# Patient Record
Sex: Male | Born: 1965
Health system: Southern US, Community
[De-identification: ages and names within clinical notes are randomized; demographics above are authoritative.]

## PROBLEM LIST (undated history)

## (undated) DIAGNOSIS — B019 Varicella without complication: Secondary | ICD-10-CM

## (undated) DIAGNOSIS — M109 Gout, unspecified: Secondary | ICD-10-CM

## (undated) DIAGNOSIS — I10 Essential (primary) hypertension: Secondary | ICD-10-CM

## (undated) DIAGNOSIS — Z8639 Personal history of other endocrine, nutritional and metabolic disease: Secondary | ICD-10-CM

## (undated) DIAGNOSIS — L989 Disorder of the skin and subcutaneous tissue, unspecified: Secondary | ICD-10-CM

## (undated) HISTORY — DX: Disorder of the skin and subcutaneous tissue, unspecified: L98.9

## (undated) HISTORY — DX: Varicella without complication: B01.9

## (undated) HISTORY — DX: Personal history of other endocrine, nutritional and metabolic disease: Z86.39

## (undated) HISTORY — DX: Gout, unspecified: M10.9

## (undated) HISTORY — DX: Essential (primary) hypertension: I10

## (undated) HISTORY — PX: ARTERY EXPLORATION: SHX5110

---
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
## 2013-08-27 ENCOUNTER — Encounter: Payer: Self-pay | Admitting: Physician Assistant

## 2013-08-27 ENCOUNTER — Ambulatory Visit (INDEPENDENT_AMBULATORY_CARE_PROVIDER_SITE_OTHER): Payer: BC Managed Care – HMO | Admitting: Physician Assistant

## 2013-08-27 VITALS — BP 136/98 | HR 79 | Temp 98.1°F | Resp 16 | Ht 73.0 in | Wt 295.2 lb

## 2013-08-27 DIAGNOSIS — I10 Essential (primary) hypertension: Secondary | ICD-10-CM | POA: Insufficient documentation

## 2013-08-27 DIAGNOSIS — Z8639 Personal history of other endocrine, nutritional and metabolic disease: Secondary | ICD-10-CM

## 2013-08-27 DIAGNOSIS — Z7189 Other specified counseling: Secondary | ICD-10-CM

## 2013-08-27 DIAGNOSIS — E1165 Type 2 diabetes mellitus with hyperglycemia: Secondary | ICD-10-CM | POA: Insufficient documentation

## 2013-08-27 DIAGNOSIS — Z862 Personal history of diseases of the blood and blood-forming organs and certain disorders involving the immune mechanism: Secondary | ICD-10-CM

## 2013-08-27 DIAGNOSIS — IMO0002 Reserved for concepts with insufficient information to code with codable children: Secondary | ICD-10-CM | POA: Insufficient documentation

## 2013-08-27 DIAGNOSIS — Z7689 Persons encountering health services in other specified circumstances: Secondary | ICD-10-CM | POA: Insufficient documentation

## 2013-08-27 DIAGNOSIS — C4492 Squamous cell carcinoma of skin, unspecified: Secondary | ICD-10-CM | POA: Insufficient documentation

## 2013-08-27 MED ORDER — LOSARTAN POTASSIUM-HCTZ 50-12.5 MG PO TABS: 1.0000 | ORAL_TABLET | Freq: Every day | ORAL | Status: DC

## 2013-08-27 NOTE — Patient Instructions (Signed)
Please retur for complete physical.  Please take blood pressure medication as directed.  We will recheck BP at next visit.  You will be contacted by Dermatology for assessment and excision of skin lesion.    You Can Quit Smoking If you are ready to quit smoking or are thinking about it, congratulations! You have chosen to help yourself be healthier and live longer! There are lots of different ways to quit smoking. Nicotine gum, nicotine patches, a nicotine inhaler, or nicotine nasal spray can help with physical craving. Hypnosis, support groups, and medicines help break the habit of smoking. TIPS TO GET OFF AND STAY OFF CIGARETTES  Learn to predict your moods. Do not let a bad situation be your excuse to have a cigarette. Some situations in your life might tempt you to have a cigarette.  Ask friends and co-workers not to smoke around you.  Make your home smoke-free.  Never have "just one" cigarette. It leads to wanting another and another. Remind yourself of your decision to quit.  On a card, make a list of your reasons for not smoking. Read it at least the same number of times a day as you have a cigarette. Tell yourself everyday, "I do not want to smoke. I choose not to smoke."  Ask someone at home or work to help you with your plan to quit smoking.  Have something planned after you eat or have a cup of coffee. Take a walk or get other exercise to perk you up. This will help to keep you from overeating.  Try a relaxation exercise to calm you down and decrease your stress. Remember, you may be tense and nervous the first two weeks after you quit. This will pass.  Find new activities to keep your hands busy. Play with a pen, coin, or rubber band. Doodle or draw things on paper.  Brush your teeth right after eating. This will help cut down the craving for the taste of tobacco after meals. You can try mouthwash too.  Try gum, breath mints, or diet candy to keep something in your mouth. IF  YOU SMOKE AND WANT TO QUIT:  Do not stock up on cigarettes. Never buy a carton. Wait until one pack is finished before you buy another.  Never carry cigarettes with you at work or at home.  Keep cigarettes as far away from you as possible. Leave them with someone else.  Never carry matches or a lighter with you.  Ask yourself, "Do I need this cigarette or is this just a reflex?"  Bet with someone that you can quit. Put cigarette money in a piggy bank every morning. If you smoke, you give up the money. If you do not smoke, by the end of the week, you keep the money.  Keep trying. It takes 21 days to change a habit!  Talk to your doctor about using medicines to help you quit. These include nicotine replacement gum, lozenges, or skin patches. Document Released: 03/04/2009 Document Revised: 07/31/2011 Document Reviewed: 03/04/2009 [facility] Patient Information 2014 [employer].

## 2013-08-27 NOTE — Assessment & Plan Note (Signed)
Lesion present for greater than 1 year.  Ulcerated lesion with raised base, seems worrisome for SCC.  Urgent referral to Dermatology placed for excisional biopsy.

## 2013-08-27 NOTE — Assessment & Plan Note (Signed)
Endorses resolved with 100+p pound weight loss.  Will return in 2 weeks for CPE with fasting labs.  Will recheck BMP and A1C at that time.

## 2013-08-27 NOTE — Progress Notes (Signed)
Patient presents to clinic today to establish care.  Acute Concerns: Needs medication refills.    Skin Lesion -- requesting referral to Dermatology.   Two lesions -- 1 on left ear. Other on mid chest. Lesions are ulcerated.  Has been present for 1+ years.  Denies change in size. Has history of multiple concerning lesions in the past that have all been benign.  No follow-up with Dermatology in 4 years.  Chronic Issues: Hypertension -- Endorses well-controlled with Hyzaar.  Needs refill of medication.  Denies chest pain, palpitations, lightheadedness, dizziness, headache or vision changes.  Hx of Diabetes 2/2 Morbid Obesity -- Endorses resolution of elevated glucose with significant weight loss.  Health Maintenance: Dental -- Overdue Vision -- UTD Immunizations -- Declines flu shot.  Due for Tetanus.  Declines at today's visit.  Past Medical History  Diagnosis Date  . HTN (hypertension)   . Gout   . Hx of diabetes mellitus     Resolved  . Chicken pox   . Skin lesion     Benign    Past Surgical History  Procedure Laterality Date  . Tonsillectomy and adenoidectomy  1973  . Artery exploration      Right Forearm    No current outpatient prescriptions on file prior to visit.   No current facility-administered medications on file prior to visit.    Allergies  Allergen Reactions  . Penicillins Anaphylaxis    Family History  Problem Relation Age of Onset  . Heart disease Father 25    Deceased  . Heart attack Father   . Hypertension Father   . Diabetes Father   . Arthritis-Osteo Mother     Living  . Colon cancer Mother   . Stroke Mother   . Hypertension Mother   . Breast cancer Mother   . Asthma Mother   . Heart disease Paternal Grandfather   . Heart attack Paternal Grandfather   . Heart attack Maternal Grandmother   . Healthy Sister   . Healthy Brother   . Asthma Son     x2  . Asthma Maternal Grandfather     History   Social History  . Marital Status:  Divorced    Spouse Name: N/A    Number of Children: N/A  . Years of Education: N/A   Occupational History  . Not on file.   Social History Main Topics  . Smoking status: Current Every Day Smoker  . Smokeless tobacco: Never Used  . Alcohol Use: 3.5 oz/week    7 drink(s) per week  . Drug Use: No  . Sexual Activity: Yes   Other Topics Concern  . Not on file   Social History Narrative  . No narrative on file   Review of Systems  Constitutional: Negative for fever, weight loss and malaise/fatigue.  Eyes: Negative for blurred vision, double vision, photophobia and pain.  Respiratory: Negative for shortness of breath.   Cardiovascular: Negative for chest pain and palpitations.  Neurological: Negative for headaches.   BP 136/98  Pulse 79  Temp(Src) 98.1 F (36.7 C) (Oral)  Resp 16  Ht 6\' 1"  (1.854 m)  Wt 295 lb 4 oz (133.925 kg)  BMI 38.96 kg/m2  SpO2 98%  Physical Exam  Vitals reviewed. Constitutional: He is oriented to person, place, and time and well-developed, well-nourished, and in no distress.  HENT:  Head: Normocephalic and atraumatic.  Right Ear: External ear normal.  Left Ear: External ear normal.  Nose: Nose normal.  Mouth/Throat: Oropharynx is  clear and moist. No oropharyngeal exudate.  Eyes: Conjunctivae are normal. Pupils are equal, round, and reactive to light.  Neck: Neck supple.  Cardiovascular: Normal rate, regular rhythm, normal heart sounds and intact distal pulses.   Pulmonary/Chest: Effort normal and breath sounds normal. No respiratory distress. He has no wheezes.  Lymphadenopathy:    He has no cervical adenopathy.  Neurological: He is alert and oriented to person, place, and time.  Skin: Skin is warm and dry.  Presence of an erythematous, ulcerated lesion just anterior to left auricle ~ 2 cm in diameter.  Presence of a 2 cm raised, ulcerated lesion of mid chest also noted.  Lesions concerning for SCC.  Psychiatric: Affect normal.     Assessment/Plan: Essential hypertension, benign Asymptomatic.  Medications refilled.  Continue current regimen.  SCC (squamous cell carcinoma) Lesion present for greater than 1 year.  Ulcerated lesion with raised base, seems worrisome for SCC.  Urgent referral to Dermatology placed for excisional biopsy.  Hx of type 2 diabetes mellitus Endorses resolved with 100+p pound weight loss.  Will return in 2 weeks for CPE with fasting labs.  Will recheck BMP and A1C at that time.  Encounter to establish care Medical history reviewed.  Patient due for tetanus.  Declines at today's visit.  Is returning for CPE with fasting labs.

## 2013-08-27 NOTE — Assessment & Plan Note (Signed)
Medical history reviewed.  Patient due for tetanus.  Declines at today's visit.  Is returning for CPE with fasting labs.

## 2013-08-27 NOTE — Progress Notes (Signed)
Pre visit review using our clinic review tool, if applicable. No additional management support is needed unless otherwise documented below in the visit note/SLS  

## 2013-08-27 NOTE — Assessment & Plan Note (Signed)
Asymptomatic.  Medications refilled.  Continue current regimen.

## 2013-09-12 ENCOUNTER — Telehealth: Payer: Self-pay | Admitting: Physician Assistant

## 2013-09-12 ENCOUNTER — Encounter: Payer: Self-pay | Admitting: Physician Assistant

## 2013-09-12 ENCOUNTER — Ambulatory Visit (INDEPENDENT_AMBULATORY_CARE_PROVIDER_SITE_OTHER): Payer: BC Managed Care – HMO | Admitting: Physician Assistant

## 2013-09-12 VITALS — BP 140/92 | HR 68 | Temp 98.7°F | Resp 16 | Ht 73.0 in | Wt 292.0 lb

## 2013-09-12 DIAGNOSIS — Z136 Encounter for screening for cardiovascular disorders: Secondary | ICD-10-CM

## 2013-09-12 DIAGNOSIS — Z8639 Personal history of other endocrine, nutritional and metabolic disease: Secondary | ICD-10-CM

## 2013-09-12 DIAGNOSIS — Z23 Encounter for immunization: Secondary | ICD-10-CM

## 2013-09-12 DIAGNOSIS — Z862 Personal history of diseases of the blood and blood-forming organs and certain disorders involving the immune mechanism: Secondary | ICD-10-CM

## 2013-09-12 DIAGNOSIS — I10 Essential (primary) hypertension: Secondary | ICD-10-CM

## 2013-09-12 DIAGNOSIS — Z0000 Encounter for general adult medical examination without abnormal findings: Secondary | ICD-10-CM

## 2013-09-12 LAB — BASIC METABOLIC PANEL
BUN: 17 mg/dL (ref 6–23)
CHLORIDE: 108 meq/L (ref 96–112)
CO2: 25 mEq/L (ref 19–32)
CREATININE: 1.02 mg/dL (ref 0.50–1.35)
Calcium: 9.2 mg/dL (ref 8.4–10.5)
Glucose, Bld: 101 mg/dL — ABNORMAL HIGH (ref 70–99)
POTASSIUM: 4.3 meq/L (ref 3.5–5.3)
Sodium: 141 mEq/L (ref 135–145)

## 2013-09-12 LAB — CBC WITH DIFFERENTIAL/PLATELET
Basophils Absolute: 0.1 10*3/uL (ref 0.0–0.1)
Basophils Relative: 1 % (ref 0–1)
EOS PCT: 5 % (ref 0–5)
Eosinophils Absolute: 0.6 10*3/uL (ref 0.0–0.7)
HEMATOCRIT: 46.5 % (ref 39.0–52.0)
Hemoglobin: 16.4 g/dL (ref 13.0–17.0)
LYMPHS ABS: 2.5 10*3/uL (ref 0.7–4.0)
LYMPHS PCT: 22 % (ref 12–46)
MCH: 32 pg (ref 26.0–34.0)
MCHC: 35.3 g/dL (ref 30.0–36.0)
MCV: 90.6 fL (ref 78.0–100.0)
MONO ABS: 0.9 10*3/uL (ref 0.1–1.0)
Monocytes Relative: 8 % (ref 3–12)
Neutro Abs: 7.2 10*3/uL (ref 1.7–7.7)
Neutrophils Relative %: 64 % (ref 43–77)
Platelets: 215 10*3/uL (ref 150–400)
RBC: 5.13 MIL/uL (ref 4.22–5.81)
RDW: 14.1 % (ref 11.5–15.5)
WBC: 11.3 10*3/uL — AB (ref 4.0–10.5)

## 2013-09-12 LAB — TSH: TSH: 1.134 u[IU]/mL (ref 0.350–4.500)

## 2013-09-12 LAB — LIPID PANEL
CHOL/HDL RATIO: 5.4 ratio
CHOLESTEROL: 172 mg/dL (ref 0–200)
HDL: 32 mg/dL — AB (ref >39)
LDL Cholesterol: 93 mg/dL (ref 0–99)
Triglycerides: 233 mg/dL — ABNORMAL HIGH (ref <150)
VLDL: 47 mg/dL — ABNORMAL HIGH (ref 0–40)

## 2013-09-12 LAB — HEPATIC FUNCTION PANEL
ALT: 31 U/L (ref 0–53)
AST: 23 U/L (ref 0–37)
Albumin: 4.1 g/dL (ref 3.5–5.2)
Alkaline Phosphatase: 57 U/L (ref 39–117)
Bilirubin, Direct: 0.1 mg/dL (ref 0.0–0.3)
Indirect Bilirubin: 0.5 mg/dL (ref 0.2–1.2)
TOTAL PROTEIN: 6.9 g/dL (ref 6.0–8.3)
Total Bilirubin: 0.6 mg/dL (ref 0.2–1.2)

## 2013-09-12 LAB — HEMOGLOBIN A1C
Hgb A1c MFr Bld: 5.9 % — ABNORMAL HIGH (ref <5.7)
Mean Plasma Glucose: 123 mg/dL — ABNORMAL HIGH (ref <117)

## 2013-09-12 NOTE — Progress Notes (Signed)
Pre visit review using our clinic review tool, if applicable. No additional management support is needed unless otherwise documented below in the visit note/SLS  

## 2013-09-12 NOTE — Assessment & Plan Note (Signed)
DASH diet handout given.  BP improved on recheck.  EKG obtained due to early beat auscultated on exam.  EKG reveals sinus bradycardia but not arrhythmia.  Continue current regimen.  Will obtain BMP.

## 2013-09-12 NOTE — Progress Notes (Signed)
Patient presents to clinic today for annual exam.  Patient is fasting for labs.   No acute concerns at today's visit.  Patient with hypertension, previously controlled on Hyzaar.  Denies chest pain, palpitations, lightheadedness, vision changes.  Endorses some decreased sleep at night 2/2 stress from new job.  Does not think that he needs medication for this.   Patient has seen dermatologist for skin lesions concerning for carcinoma.  Patient is awaiting biopsy results.  Past Medical History  Diagnosis Date  . HTN (hypertension)   . Gout   . Hx of diabetes mellitus     Resolved  . Chicken pox   . Skin lesion     Benign    Current Outpatient Prescriptions on File Prior to Visit  Medication Sig Dispense Refill  . allopurinol (ZYLOPRIM) 300 MG tablet Take 300 mg by mouth daily as needed.      Marland Kitchen aspirin 81 MG tablet Take 81 mg by mouth daily.      . Chromium Picolinate 200 MCG CAPS Take 1 capsule by mouth daily.      Marland Kitchen losartan-hydrochlorothiazide (HYZAAR) 50-12.5 MG per tablet Take 1 tablet by mouth daily.  30 tablet  1   No current facility-administered medications on file prior to visit.    Allergies  Allergen Reactions  . Penicillins Anaphylaxis    Family History  Problem Relation Age of Onset  . Heart disease Father 60    Deceased  . Heart attack Father   . Hypertension Father   . Diabetes Father   . Arthritis-Osteo Mother     Living  . Colon cancer Mother   . Stroke Mother   . Hypertension Mother   . Breast cancer Mother   . Asthma Mother   . Heart disease Paternal Grandfather   . Heart attack Paternal Grandfather   . Heart attack Maternal Grandmother   . Healthy Sister   . Healthy Brother   . Asthma Son     x2  . Asthma Maternal Grandfather     History   Social History  . Marital Status: Divorced    Spouse Name: N/A    Number of Children: N/A  . Years of Education: N/A   Social History Main Topics  . Smoking status: Current Every Day Smoker  .  Smokeless tobacco: Never Used  . Alcohol Use: 3.5 oz/week    7 drink(s) per week  . Drug Use: No  . Sexual Activity: Yes   Other Topics Concern  . None   Social History Narrative  . None   Review of Systems - See HPI.  All other ROS are negative.  BP 140/92  Pulse 68  Temp(Src) 98.7 F (37.1 C) (Oral)  Resp 16  Ht 6\' 1"  (1.854 m)  Wt 292 lb (132.45 kg)  BMI 38.53 kg/m2  SpO2 97%  Physical Exam  Vitals reviewed. Constitutional: He is oriented to person, place, and time and well-developed, well-nourished, and in no distress.  HENT:  Head: Normocephalic and atraumatic.  Right Ear: External ear normal.  Left Ear: External ear normal.  Nose: Nose normal.  Mouth/Throat: Oropharynx is clear and moist. No oropharyngeal exudate.  TM within normal limits bilaterally.  Eyes: Conjunctivae are normal. Pupils are equal, round, and reactive to light.  Neck: Neck supple.  Cardiovascular: Normal rate, normal heart sounds and intact distal pulses.   No murmur heard. Occasional early beat auscultated.  Pulmonary/Chest: Effort normal and breath sounds normal. No respiratory distress. He has no  wheezes. He has no rales. He exhibits no tenderness.  Abdominal: Soft. Bowel sounds are normal. He exhibits no distension and no mass. There is no tenderness. There is no rebound and no guarding.  Musculoskeletal: Normal range of motion. He exhibits no tenderness.  Lymphadenopathy:    He has no cervical adenopathy.  Neurological: He is alert and oriented to person, place, and time. He has normal reflexes. No cranial nerve deficit.  Psychiatric: Affect normal.   Assessment/Plan: Essential hypertension, benign DASH diet handout given.  BP improved on recheck.  EKG obtained due to early beat auscultated on exam.  EKG reveals sinus bradycardia but not arrhythmia.  Continue current regimen.  Will obtain BMP.  Visit for preventive health examination Will obtain fasting labs.  Tetanus given.  Other  health maintenance UTD.  Hx of type 2 diabetes mellitus Will check BMP and A1C

## 2013-09-12 NOTE — Telephone Encounter (Signed)
Dr. Freddi Starr -- [facility]. [address], [place], [place] [address] [phone]

## 2013-09-12 NOTE — Patient Instructions (Signed)
Please obtain labs.  I will call you with your results.  Continue medications as directed.  Follow-up with Dermatologist.  Read information below on DASH diet.  Try to increase exercise as this is a good stress reliever and will help you rest at night.  We will determine follow-up based on lab results.  DASH Diet The DASH diet stands for "Dietary Approaches to Stop Hypertension." It is a healthy eating plan that has been shown to reduce high blood pressure (hypertension) in as little as 14 days, while also possibly providing other significant health benefits. These other health benefits include reducing the risk of breast cancer after menopause and reducing the risk of type 2 diabetes, heart disease, colon cancer, and stroke. Health benefits also include weight loss and slowing kidney failure in patients with chronic kidney disease.  DIET GUIDELINES  Limit salt (sodium). Your diet should contain less than 1500 mg of sodium daily.  Limit refined or processed carbohydrates. Your diet should include mostly whole grains. Desserts and added sugars should be used sparingly.  Include small amounts of heart-healthy fats. These types of fats include nuts, oils, and tub margarine. Limit saturated and trans fats. These fats have been shown to be harmful in the body. CHOOSING FOODS  The following food groups are based on a 2000 calorie diet. See your Registered Dietitian for individual calorie needs. Grains and Grain Products (6 to 8 servings daily)  Eat More Often: Whole-wheat bread, brown rice, whole-grain or wheat pasta, quinoa, popcorn without added fat or salt (air popped).  Eat Less Often: White bread, white pasta, white rice, cornbread. Vegetables (4 to 5 servings daily)  Eat More Often: Fresh, frozen, and canned vegetables. Vegetables may be raw, steamed, roasted, or grilled with a minimal amount of fat.  Eat Less Often/Avoid: Creamed or fried vegetables. Vegetables in a cheese sauce. Fruit (4 to  5 servings daily)  Eat More Often: All fresh, canned (in natural juice), or frozen fruits. Dried fruits without added sugar. One hundred percent fruit juice ( cup [237 mL] daily).  Eat Less Often: Dried fruits with added sugar. Canned fruit in light or heavy syrup. [employer], Fish, and Poultry (2 servings or less daily. One serving is 3 to 4 oz [85-114 g]).  Eat More Often: Ninety percent or leaner ground beef, tenderloin, sirloin. Round cuts of beef, chicken breast, [place] breast. All fish. Grill, bake, or broil your meat. Nothing should be fried.  Eat Less Often/Avoid: Fatty cuts of meat, [place], or chicken leg, thigh, or wing. Fried cuts of meat or fish. Dairy (2 to 3 servings)  Eat More Often: Low-fat or fat-free milk, low-fat plain or light yogurt, reduced-fat or part-skim cheese.  Eat Less Often/Avoid: Milk (whole, 2%).Whole milk yogurt. Full-fat cheeses. Nuts, Seeds, and Legumes (4 to 5 servings per week)  Eat More Often: All without added salt.  Eat Less Often/Avoid: Salted nuts and seeds, canned beans with added salt. Fats and Sweets (limited)  Eat More Often: Vegetable oils, tub margarines without trans fats, sugar-free gelatin. Mayonnaise and salad dressings.  Eat Less Often/Avoid: Coconut oils, palm oils, butter, stick margarine, cream, half and half, cookies, candy, pie. FOR MORE INFORMATION The Dash Diet Eating Plan: www.dashdiet.org Document Released: 04/27/2011 Document Revised: 07/31/2011 Document Reviewed: 04/27/2011 [facility] Patient Information 2014 [place], [place].

## 2013-09-12 NOTE — Assessment & Plan Note (Signed)
Will obtain fasting labs.  Tetanus given.  Other health maintenance UTD.

## 2013-09-12 NOTE — Telephone Encounter (Signed)
Relevant patient education assigned to patient using Emmi. ° °

## 2013-09-12 NOTE — Assessment & Plan Note (Signed)
Will check BMP and A1C

## 2013-09-12 NOTE — Telephone Encounter (Signed)
Patient says that he didn't get the name of a chiropractor that ya'll were discussing about him going to?

## 2013-09-12 NOTE — Addendum Note (Signed)
Addended by: Rockwell Germany on: 09/12/2013 10:32 AM   Modules accepted: Orders

## 2013-09-13 LAB — URINALYSIS, ROUTINE W REFLEX MICROSCOPIC
BILIRUBIN URINE: NEGATIVE
GLUCOSE, UA: NEGATIVE mg/dL
Hgb urine dipstick: NEGATIVE
Ketones, ur: NEGATIVE mg/dL
Leukocytes, UA: NEGATIVE
Nitrite: NEGATIVE
PROTEIN: NEGATIVE mg/dL
Specific Gravity, Urine: 1.021 (ref 1.005–1.030)
Urobilinogen, UA: 0.2 mg/dL (ref 0.0–1.0)
pH: 5.5 (ref 5.0–8.0)

## 2013-09-13 LAB — URINALYSIS, MICROSCOPIC ONLY
Bacteria, UA: NONE SEEN
Casts: NONE SEEN
Crystals: NONE SEEN
Squamous Epithelial / LPF: NONE SEEN

## 2013-09-13 LAB — PSA: PSA: 0.22 ng/mL (ref <=4.00)

## 2013-09-15 ENCOUNTER — Telehealth: Payer: Self-pay | Admitting: Physician Assistant

## 2013-09-15 NOTE — Telephone Encounter (Signed)
Left detailed message informing patient of this. °

## 2013-09-15 NOTE — Telephone Encounter (Signed)
Relevant patient education assigned to patient using Emmi. ° °

## 2013-10-08 ENCOUNTER — Telehealth: Payer: Self-pay | Admitting: Physician Assistant

## 2013-10-08 NOTE — Telephone Encounter (Signed)
Please make patient aware that I have received the Wellness form from his employer.  I have completed form and have placed it to be faxed back to them.  He should call them in a couple of days to verify they have received the form.

## 2013-10-10 NOTE — Telephone Encounter (Signed)
LMOM with contact name and number with provider instructions/SLS

## 2013-10-23 ENCOUNTER — Other Ambulatory Visit: Payer: Self-pay | Admitting: Physician Assistant

## 2013-10-23 NOTE — Telephone Encounter (Signed)
Rx request to pharmacy/SLS  

## 2013-11-17 ENCOUNTER — Telehealth: Payer: Self-pay | Admitting: *Deleted

## 2013-11-17 MED ORDER — LOSARTAN POTASSIUM-HCTZ 50-12.5 MG PO TABS: ORAL_TABLET | ORAL | Status: DC

## 2013-11-17 NOTE — Telephone Encounter (Signed)
Received fax from [facility] requesting to dispense 90 day supply of losartan-hctz. Refill sent, #90 x 1 refill.

## 2013-12-09 ENCOUNTER — Telehealth: Payer: Self-pay

## 2013-12-09 NOTE — Telephone Encounter (Signed)
Pt contacted office about a Wellness form to his [employer] that was to be submitted.Pt states company has not received it yet.Is there a way to Re-submit this form? Plz Advise

## 2013-12-10 NOTE — Telephone Encounter (Signed)
LMOM with contact name and number for return call RE: original paperwork sent to Employer in May '15 and per provider instructions, pt will need to find out if he can obtain a new form for re-completion/SLS

## 2013-12-11 NOTE — Telephone Encounter (Signed)
Caller states that they can get a new Wellness Form and will have it sent to office to be completed & faxed to Insurance holder again/SLS

## 2014-01-07 ENCOUNTER — Telehealth: Payer: Self-pay | Admitting: Physician Assistant

## 2014-01-07 NOTE — Telephone Encounter (Signed)
Finally received Wellness forms again from company.  Have completed forms.  Have faxed them over and received confirmation.  I have kept a copy for our records and have a copy here for him if he would like it.

## 2014-01-12 NOTE — Telephone Encounter (Signed)
Patient informed, understood & request copy to be mailed; letter out 08.24.15/SLS

## 2014-05-24 ENCOUNTER — Other Ambulatory Visit: Payer: Self-pay | Admitting: Family

## 2014-05-25 ENCOUNTER — Telehealth: Payer: Self-pay | Admitting: Physician Assistant

## 2014-05-25 NOTE — Telephone Encounter (Signed)
Requested drug refills are authorized for 30-day only per provider, the patient needs further evaluation and/or laboratory testing before further refills are given. Ask him to make an appointment for this; pharmacy made aware via telephone/SLS

## 2014-05-25 NOTE — Telephone Encounter (Signed)
Caller name: mimi from [facility]  Relation to pt: Call back number: [phone] Pharmacy:  Reason for call:   Per patient insurance only covers for a 90 day supply of losartan. Please refax.

## 2014-07-04 ENCOUNTER — Other Ambulatory Visit: Payer: Self-pay | Admitting: Family

## 2014-10-02 ENCOUNTER — Other Ambulatory Visit: Payer: Self-pay | Admitting: Physician Assistant

## 2014-10-09 ENCOUNTER — Other Ambulatory Visit: Payer: Self-pay | Admitting: Physician Assistant

## 2014-12-11 ENCOUNTER — Ambulatory Visit (INDEPENDENT_AMBULATORY_CARE_PROVIDER_SITE_OTHER): Payer: Self-pay | Admitting: Physician Assistant

## 2014-12-11 ENCOUNTER — Other Ambulatory Visit (HOSPITAL_COMMUNITY)
Admission: RE | Admit: 2014-12-11 | Discharge: 2014-12-11 | Disposition: A | Discharge status: Home / Self Care | Payer: Self-pay | Source: Ambulatory Visit | Attending: Physician Assistant | Admitting: Physician Assistant

## 2014-12-11 ENCOUNTER — Encounter: Payer: Self-pay | Admitting: Physician Assistant

## 2014-12-11 ENCOUNTER — Telehealth: Payer: Self-pay | Admitting: Physician Assistant

## 2014-12-11 VITALS — BP 142/90 | HR 89 | Temp 98.0°F | Ht 73.0 in | Wt 297.0 lb

## 2014-12-11 DIAGNOSIS — B3742 Candidal balanitis: Secondary | ICD-10-CM

## 2014-12-11 DIAGNOSIS — R5382 Chronic fatigue, unspecified: Secondary | ICD-10-CM

## 2014-12-11 DIAGNOSIS — R369 Urethral discharge, unspecified: Secondary | ICD-10-CM

## 2014-12-11 DIAGNOSIS — Z113 Encounter for screening for infections with a predominantly sexual mode of transmission: Secondary | ICD-10-CM | POA: Insufficient documentation

## 2014-12-11 DIAGNOSIS — R631 Polydipsia: Secondary | ICD-10-CM

## 2014-12-11 LAB — CBC WITH DIFFERENTIAL/PLATELET
Basophils Absolute: 0.1 10*3/uL (ref 0.0–0.1)
Basophils Relative: 1 % (ref 0–1)
EOS ABS: 0.5 10*3/uL (ref 0.0–0.7)
Eosinophils Relative: 5 % (ref 0–5)
HCT: 48.3 % (ref 39.0–52.0)
Hemoglobin: 16.4 g/dL (ref 13.0–17.0)
Lymphocytes Relative: 29 % (ref 12–46)
Lymphs Abs: 2.6 10*3/uL (ref 0.7–4.0)
MCH: 31.9 pg (ref 26.0–34.0)
MCHC: 34 g/dL (ref 30.0–36.0)
MCV: 94 fL (ref 78.0–100.0)
MPV: 11.4 fL (ref 8.6–12.4)
Monocytes Absolute: 0.5 10*3/uL (ref 0.1–1.0)
Monocytes Relative: 6 % (ref 3–12)
Neutro Abs: 5.3 10*3/uL (ref 1.7–7.7)
Neutrophils Relative %: 59 % (ref 43–77)
Platelets: 160 10*3/uL (ref 150–400)
RBC: 5.14 MIL/uL (ref 4.22–5.81)
RDW: 13.2 % (ref 11.5–15.5)
WBC: 9 10*3/uL (ref 4.0–10.5)

## 2014-12-11 LAB — HEMOGLOBIN A1C
HEMOGLOBIN A1C: 12.5 % — AB (ref <5.7)
Mean Plasma Glucose: 312 mg/dL — ABNORMAL HIGH (ref <117)

## 2014-12-11 LAB — COMPREHENSIVE METABOLIC PANEL
ALBUMIN: 4.3 g/dL (ref 3.5–5.2)
ALT: 60 U/L — AB (ref 0–53)
AST: 58 U/L — AB (ref 0–37)
Alkaline Phosphatase: 131 U/L — ABNORMAL HIGH (ref 39–117)
BUN: 14 mg/dL (ref 6–23)
CHLORIDE: 97 meq/L (ref 96–112)
CO2: 21 meq/L (ref 19–32)
Calcium: 9.2 mg/dL (ref 8.4–10.5)
Creat: 1.04 mg/dL (ref 0.50–1.35)
GLUCOSE: 481 mg/dL — AB (ref 70–99)
Potassium: 4.1 mEq/L (ref 3.5–5.3)
Sodium: 135 mEq/L (ref 135–145)
TOTAL PROTEIN: 7.5 g/dL (ref 6.0–8.3)
Total Bilirubin: 1.1 mg/dL (ref 0.2–1.2)

## 2014-12-11 LAB — POCT URINALYSIS DIPSTICK
Bilirubin, UA: NEGATIVE
Ketones, UA: NEGATIVE
Leukocytes, UA: NEGATIVE
Nitrite, UA: NEGATIVE
PH UA: 6
Protein, UA: NEGATIVE
RBC UA: NEGATIVE
Spec Grav, UA: 1.015
UROBILINOGEN UA: 0.2

## 2014-12-11 MED ORDER — CLOTRIMAZOLE-BETAMETHASONE 1-0.05 % EX CREA: 1.0000 "application " | TOPICAL_CREAM | Freq: Two times a day (BID) | CUTANEOUS | Status: DC

## 2014-12-11 MED ORDER — FLUCONAZOLE 50 MG PO TABS: 50.0000 mg | ORAL_TABLET | Freq: Every day | ORAL | Status: DC

## 2014-12-11 NOTE — Telephone Encounter (Signed)
Noted. Routed to C. Hassell Done, PA-C.

## 2014-12-11 NOTE — Progress Notes (Signed)
Patient presents to clinic today c/o worsening fatigue over the past 3-4 months. Patient has not been seen in over a year. Patient denies depressed mood or change to stress levels that he can attribute fatigue to. Has been doing multiple colon cleanses and OTC weight loss supplements. Endorses stopping BP medication around 3 months ago as he did not "feel any different" while taking medications. Patient notes increased thirst and urinary frequency but states he is drinking large amounts of water. Denies dysuria, hematuria or flank pain. Endorses red itching rash of penile head and shaft beginning 3 weeks ago. States he has not been sexually active in the past year. Denies discharge from penis. Has started some OTC Monistat that seems to be helping symptoms. Patient is a circumcised male.  Past Medical History  Diagnosis Date  . HTN (hypertension)   . Gout   . Hx of diabetes mellitus     Resolved  . Chicken pox   . Skin lesion     Benign    Current Outpatient Prescriptions on File Prior to Visit  Medication Sig Dispense Refill  . allopurinol (ZYLOPRIM) 300 MG tablet Take 300 mg by mouth daily as needed.    Marland Kitchen aspirin 81 MG tablet Take 81 mg by mouth daily.    . Chromium Picolinate 200 MCG CAPS Take 1 capsule by mouth daily.    Marland Kitchen losartan-hydrochlorothiazide (HYZAAR) 50-12.5 MG per tablet TAKE 1 TABLET BY MOUTH EVERY DAY (Patient not taking: Reported on 12/11/2014) 30 tablet 0   No current facility-administered medications on file prior to visit.    Allergies  Allergen Reactions  . Penicillins Anaphylaxis    Family History  Problem Relation Age of Onset  . Heart disease Father 48    Deceased  . Heart attack Father   . Hypertension Father   . Diabetes Father   . Arthritis-Osteo Mother     Living  . Colon cancer Mother   . Stroke Mother   . Hypertension Mother   . Breast cancer Mother   . Asthma Mother   . Heart disease Paternal Grandfather   . Heart attack Paternal  Grandfather   . Heart attack Maternal Grandmother   . Healthy Sister   . Healthy Brother   . Asthma Son     x2  . Asthma Maternal Grandfather     History   Social History  . Marital Status: Divorced    Spouse Name: N/A  . Number of Children: N/A  . Years of Education: N/A   Social History Main Topics  . Smoking status: Former Smoker    Quit date: 05/14/2014  . Smokeless tobacco: Never Used  . Alcohol Use: 4.2 oz/week    7 Standard drinks or equivalent per week  . Drug Use: No  . Sexual Activity: Yes   Other Topics Concern  . None   Social History Narrative   Review of Systems - See HPI.  All other ROS are negative.  BP 142/90 mmHg  Pulse 89  Temp(Src) 98 F (36.7 C) (Oral)  Ht _0  (1.854 m)  Wt 297 lb (134.718 kg)  BMI 39.19 kg/m2  SpO2 97%  Physical Exam  Constitutional: He is oriented to person, place, and time and well-developed, well-nourished, and in no distress.  HENT:  Head: Normocephalic and atraumatic.  Eyes: Conjunctivae are normal.  Neck: Neck supple.  Cardiovascular: Normal rate, regular rhythm, normal heart sounds and intact distal pulses.   Pulmonary/Chest: Effort normal and breath  sounds normal. No respiratory distress. He has no wheezes. He has no rales. He exhibits no tenderness.  Genitourinary:  Significant balanitis noted on exam with evidence of candidal etiology.  Neurological: He is alert and oriented to person, place, and time.  Skin: Skin is warm and dry. No rash noted.  Psychiatric: Affect normal.  Vitals reviewed.   Recent Results (from the past 2160 hour(s))  POCT urinalysis dipstick     Status: None   Collection Time: 12/11/14  4:11 PM  Result Value Ref Range   Color, UA yellow    Clarity, UA clear    Glucose, UA 3+    Bilirubin, UA neg    Ketones, UA neg    Spec Grav, UA 1.015    Blood, UA neg    pH, UA 6.0    Protein, UA neg    Urobilinogen, UA 0.2    Nitrite, UA neg    Leukocytes, UA Negative Negative  CBC  w/Diff     Status: None   Collection Time: 12/11/14  4:29 PM  Result Value Ref Range   WBC 9.0 4.0 - 10.5 K/uL   RBC 5.14 4.22 - 5.81 MIL/uL   Hemoglobin 16.4 13.0 - 17.0 g/dL   HCT 48.3 39.0 - 52.0 %   MCV 94.0 78.0 - 100.0 fL   MCH 31.9 26.0 - 34.0 pg   MCHC 34.0 30.0 - 36.0 g/dL   RDW 13.2 11.5 - 15.5 %   Platelets 160 150 - 400 K/uL   MPV 11.4 8.6 - 12.4 fL   Neutrophils Relative % 59 43 - 77 %   Neutro Abs 5.3 1.7 - 7.7 K/uL   Lymphocytes Relative 29 12 - 46 %   Lymphs Abs 2.6 0.7 - 4.0 K/uL   Monocytes Relative 6 3 - 12 %   Monocytes Absolute 0.5 0.1 - 1.0 K/uL   Eosinophils Relative 5 0 - 5 %   Eosinophils Absolute 0.5 0.0 - 0.7 K/uL   Basophils Relative 1 0 - 1 %   Basophils Absolute 0.1 0.0 - 0.1 K/uL   Smear Review Criteria for review not met   Comp Met (CMET)     Status: Abnormal   Collection Time: 12/11/14  4:29 PM  Result Value Ref Range   Sodium 135 135 - 145 mEq/L   Potassium 4.1 3.5 - 5.3 mEq/L   Chloride 97 96 - 112 mEq/L   CO2 21 19 - 32 mEq/L   Glucose, Bld 481 (H) 70 - 99 mg/dL   BUN 14 6 - 23 mg/dL   Creat 1.04 0.50 - 1.35 mg/dL   Total Bilirubin 1.1 0.2 - 1.2 mg/dL   Alkaline Phosphatase 131 (H) 39 - 117 U/L   AST 58 (H) 0 - 37 U/L   ALT 60 (H) 0 - 53 U/L   Total Protein 7.5 6.0 - 8.3 g/dL   Albumin 4.3 3.5 - 5.2 g/dL   Calcium 9.2 8.4 - 10.5 mg/dL  Hemoglobin A1c     Status: Abnormal   Collection Time: 12/11/14  4:29 PM  Result Value Ref Range   Hgb A1c MFr Bld 12.5 (H) <5.7 %    Comment:  According to the ADA Clinical Practice Recommendations for 2011, when HbA1c is used as a screening test:     >=6.5%   Diagnostic of Diabetes Mellitus            (if abnormal result is confirmed)   5.7-6.4%   Increased risk of developing Diabetes Mellitus   References:Diagnosis and Classification of Diabetes Mellitus,Diabetes YQMG,5003,70(WUGQB 1):S62-S69 and Standards of Medical Care  in         Diabetes - 2011,Diabetes VQXI,5038,88 (Suppl 1):S11-S61.      Mean Plasma Glucose 312 (H) <117 mg/dL  Testosterone     Status: Abnormal   Collection Time: 12/11/14  4:29 PM  Result Value Ref Range   Testosterone 130 (L) 300 - 890 ng/dL    Comment:           Tanner Stage       Male              Male               I              < 30 ng/dL        < 10 ng/dL               II             < 150 ng/dL       < 30 ng/dL               III            100-320 ng/dL     < 35 ng/dL               IV             200-970 ng/dL     15-40 ng/dL               V/Adult        300-890 ng/dL     10-70 ng/dL     TSH     Status: None   Collection Time: 12/11/14  4:29 PM  Result Value Ref Range   TSH 1.636 0.350 - 4.500 uIU/mL    Assessment/Plan: Candidal balanitis Urine ancillary testing sent. Significant swelling. Rx Diflucan daily. Supportive measures reviewed. Follow-up next Friday for reassessment.  Chronic fatigue Likely Multifactorial. Patient with history of DM II previously controlled with diet and exercise. Last A1C < 6. However patient now with symptoms of hyperglycemia and candidal balanitis. Will obtain CBC, CMP, A1C, UA, Testosterone and TSH. Dietary measures discussed. Will treat based on results.

## 2014-12-11 NOTE — Telephone Encounter (Signed)
[facility] - Client [facility] Patient Name: Louis Davis DOB: [birth year] Initial Comment caller states he has hands tingling, freq urination, blurred vision and dizziness, has been out of his BP meds for 90 days Nurse Assessment Nurse: Erlene Quan, RN, Manuela Schwartz Date/Time Eilene Ghazi Time): 12/11/2014 10:36:30 AM Confirm and document reason for call. If symptomatic, describe symptoms. ---caller states he has hands tingling then states not really tingling , more warm and red hands and soles of feet and also penis , freq urination( but he is drinking tons of water a day between a colon prep and diet he is on ), blurred vision at a distance and dizziness just this morning - that has gotten better now , has been out of his BP meds for 90 days - states he felt like it was not doing him any good, if he skipped a day he could not tell a difference - he never called to report that just put it off - he did a colon flush and about 5 days into that is when he started to feel bad so he stopped it - it gave him a bad yeast infection he has red burning and discharge from penis for 3 weeks now - home treatment is not helping - just over all does not feel well generally weak and fatigued and not himself Has the patient traveled out of the country within the last 30 days? ---Not Applicable Does the patient require triage? ---Yes Related visit to physician within the last 2 weeks? ---No Does the PT have any chronic conditions? (i.e. diabetes, asthma, etc.) ---No Guidelines Guideline Title Affirmed Question Affirmed Notes Penis and Scrotum Symptoms Pus (white, yellow) or bloody discharge from end of penis Weakness (Generalized) and Fatigue [1] MILD weakness (i.e., does not interfere with ability to work, go to school, normal activities) AND [2] persists > 1 week Final Disposition User See PCP When Office is Open (within 3 days) Erlene Quan,  RN, Manuela Schwartz Comments appointment scheduled with Elyn Aquas at 3:45 this afternoon Referrals REFERRED TO PCP OFFICE Disagree/Comply: Comply Disagree/Comply: Comply

## 2014-12-11 NOTE — Patient Instructions (Addendum)
Please go to the lab for blood work. I will call you with your results. No more cleanses as they are harming your system. Start a daily probiotic (Align, Digestive Advantage, Culturelle). Keep hydrated and well-nourished. Limit salt intake.  There is a lot of sugar in your urine which makes me concerned for diabetes which I am checking for.   For penile yeast infection, please take the Diflucan daily as directed. Use Lotrisone cream as directed.

## 2014-12-11 NOTE — Progress Notes (Signed)
Pre visit review using our clinic review tool, if applicable. No additional management support is needed unless otherwise documented below in the visit note. 

## 2014-12-12 LAB — TSH: TSH: 1.636 u[IU]/mL (ref 0.350–4.500)

## 2014-12-12 LAB — TESTOSTERONE: Testosterone: 130 ng/dL — ABNORMAL LOW (ref 300–890)

## 2014-12-13 DIAGNOSIS — R5382 Chronic fatigue, unspecified: Secondary | ICD-10-CM | POA: Insufficient documentation

## 2014-12-13 DIAGNOSIS — B3742 Candidal balanitis: Secondary | ICD-10-CM | POA: Insufficient documentation

## 2014-12-13 NOTE — Assessment & Plan Note (Addendum)
Likely Multifactorial. Patient with history of DM II previously controlled with diet and exercise. Last A1C < 6. However patient now with symptoms of hyperglycemia and candidal balanitis. Will obtain CBC, CMP, A1C, UA, Testosterone and TSH. Dietary measures discussed. Will treat based on results.

## 2014-12-13 NOTE — Assessment & Plan Note (Signed)
Urine ancillary testing sent. Significant swelling. Rx Diflucan daily. Supportive measures reviewed. Follow-up next Friday for reassessment.

## 2014-12-14 ENCOUNTER — Telehealth: Payer: Self-pay | Admitting: *Deleted

## 2014-12-14 MED ORDER — METFORMIN HCL 1000 MG PO TABS: ORAL_TABLET | ORAL | Status: DC

## 2014-12-14 NOTE — Telephone Encounter (Signed)
Called and spoke with the pt's wife and informed her of recent lab results and note.  She verbalized understanding.  Pt was scheduled for a follow-up appt for Friday (12/18/14 @ 3:30pm).  New prescription sent to the pharmacy.//AB/CMA

## 2014-12-14 NOTE — Telephone Encounter (Signed)
-----   Message from Brunetta Jeans, PA-C sent at 12/13/2014  6:07 PM EDT ----- Glucose in 400s non-fasting with A1C at 12.5. We need to start treatment for diabetes immediately. I want to start him on Metformin 1000 mg once daily for a week, then increase to 1000 mg twice daily as long as he is tolerating medication. Continue Diflucan as directed for penile yeast infection. I want to see him at follow-up in this week for repeat assessment of infection but also for diabetes education and further discussion of treatment.  His liver enzymes were just slightly elevated. We will discuss further at visit. Testosterone low which could potentially contribute to fatigue, but at the moment diabetes is the number one culprit -- would like to see him this week around Friday if possible.

## 2014-12-15 LAB — URINE CYTOLOGY ANCILLARY ONLY
Chlamydia: NEGATIVE
NEISSERIA GONORRHEA: NEGATIVE

## 2014-12-16 LAB — URINE CYTOLOGY ANCILLARY ONLY: Candida vaginitis: NEGATIVE

## 2014-12-18 ENCOUNTER — Ambulatory Visit (INDEPENDENT_AMBULATORY_CARE_PROVIDER_SITE_OTHER): Payer: BLUE CROSS/BLUE SHIELD | Admitting: Physician Assistant

## 2014-12-18 ENCOUNTER — Encounter: Payer: Self-pay | Admitting: Physician Assistant

## 2014-12-18 VITALS — BP 158/90 | HR 77 | Temp 98.1°F | Ht 73.0 in | Wt 298.8 lb

## 2014-12-18 DIAGNOSIS — IMO0002 Reserved for concepts with insufficient information to code with codable children: Secondary | ICD-10-CM

## 2014-12-18 DIAGNOSIS — I10 Essential (primary) hypertension: Secondary | ICD-10-CM | POA: Diagnosis not present

## 2014-12-18 DIAGNOSIS — E1165 Type 2 diabetes mellitus with hyperglycemia: Secondary | ICD-10-CM

## 2014-12-18 MED ORDER — LISINOPRIL 10 MG PO TABS: 10.0000 mg | ORAL_TABLET | Freq: Every day | ORAL | Status: DC

## 2014-12-18 NOTE — Patient Instructions (Signed)
Please increase Metformin to 1 tablet in the morning and 1/2 tablet at night over the next week, before increasing to 1 tablet twice daily.  Please start the lisinopril. Try to follow the meal planning guide to help with calorie counting.  Check blood sugar each morning before having anything to eat or drink.  Write this number down and bring to your follow-up in 1 month. Our ultimate goal is an A1C < 7 and fasting sugars 80-120.  We will get there but it will take some time.  Remember to schedule a lab appointment for 1 week to recheck liver enzymes. I will call you with your results.  Again follow-up with me in 1 month.

## 2014-12-18 NOTE — Progress Notes (Signed)
Pre visit review using our clinic review tool, if applicable. No additional management support is needed unless otherwise documented below in the visit note. 

## 2014-12-19 NOTE — Assessment & Plan Note (Signed)
Tolerating Metformin. Will increase to 1000 mg AM and 500 mg PM for 1 week before increasing to 1000 mg BID. ACEI restarted. Patient has ordered glucometer. Demonstration for fasting blood sugar checks giving by nursing staff. Dietary recommendations given. Handout given to patient. Will follow-up 1 month. Goal is A1C < 7 and fasting sugars 80-120.

## 2014-12-19 NOTE — Progress Notes (Signed)
Patient presents to clinic today for follow-up of newly diagnosed DM II. Patient with recent symptoms of hyperglycemia including polyuria, polydipsia as well as candidal balanitis. A1C at 12.5. Patient started on Metformin. Tolerating well. Is currently on 1000 mg per day. Denies side effect of medication. Has not begun checking sugars daily but has made changes to portion size and has decreased carb intake. BP also remains elevated. Recent microalbumin elevated. Will need to be restarted on ACEI.  Endorses balanitis resolved with Diflucan and Lortisone cream.  Past Medical History  Diagnosis Date  . HTN (hypertension)   . Gout   . Hx of diabetes mellitus     Resolved  . Chicken pox   . Skin lesion     Benign    Current Outpatient Prescriptions on File Prior to Visit  Medication Sig Dispense Refill  . aspirin 81 MG tablet Take 81 mg by mouth daily.    . metFORMIN (GLUCOPHAGE) 1000 MG tablet Take 1 tablet by mouth once daily for a week, then increase to 1074m twice daily. 60 tablet 1  . allopurinol (ZYLOPRIM) 300 MG tablet Take 300 mg by mouth daily as needed.     No current facility-administered medications on file prior to visit.    Allergies  Allergen Reactions  . Penicillins Anaphylaxis    Family History  Problem Relation Age of Onset  . Heart disease Father 565   Deceased  . Heart attack Father   . Hypertension Father   . Diabetes Father   . Arthritis-Osteo Mother     Living  . Colon cancer Mother   . Stroke Mother   . Hypertension Mother   . Breast cancer Mother   . Asthma Mother   . Heart disease Paternal Grandfather   . Heart attack Paternal Grandfather   . Heart attack Maternal Grandmother   . Healthy Sister   . Healthy Brother   . Asthma Son     x2  . Asthma Maternal Grandfather     History   Social History  . Marital Status: Divorced    Spouse Name: N/A  . Number of Children: N/A  . Years of Education: N/A   Social History Main Topics  .  Smoking status: Former Smoker    Quit date: 05/14/2014  . Smokeless tobacco: Never Used  . Alcohol Use: 4.2 oz/week    7 Standard drinks or equivalent per week  . Drug Use: No  . Sexual Activity: Yes   Other Topics Concern  . None   Social History Narrative    Review of Systems - See HPI.  All other ROS are negative.  BP 158/90 mmHg  Pulse 77  Temp(Src) 98.1 F (36.7 C) (Oral)  Ht 6' 1"  (1.854 m)  Wt 298 lb 12.8 oz (135.535 kg)  BMI 39.43 kg/m2  SpO2 97%  Physical Exam  Constitutional: He is oriented to person, place, and time and well-developed, well-nourished, and in no distress.  HENT:  Head: Normocephalic and atraumatic.  Cardiovascular: Normal rate, regular rhythm, normal heart sounds and intact distal pulses.   Pulmonary/Chest: Effort normal and breath sounds normal. No respiratory distress. He has no wheezes. He has no rales. He exhibits no tenderness.  Neurological: He is alert and oriented to person, place, and time.  Skin: Skin is warm and dry. No rash noted.  Psychiatric: Affect normal.  Vitals reviewed.   Recent Results (from the past 2160 hour(s))  Urine cytology ancillary only  Status: None   Collection Time: 12/11/14 12:00 AM  Result Value Ref Range   Chlamydia Negative     Comment: Normal Reference Range - Negative   Neisseria gonorrhea Negative     Comment: Normal Reference Range - Negative  Urine cytology ancillary only     Status: None   Collection Time: 12/11/14 12:00 AM  Result Value Ref Range   Candida vaginitis Negative for Candida Vaginitis Microorganisms     Comment: Normal Reference Range - Negative  POCT urinalysis dipstick     Status: None   Collection Time: 12/11/14  4:11 PM  Result Value Ref Range   Color, UA yellow    Clarity, UA clear    Glucose, UA 3+    Bilirubin, UA neg    Ketones, UA neg    Spec Grav, UA 1.015    Blood, UA neg    pH, UA 6.0    Protein, UA neg    Urobilinogen, UA 0.2    Nitrite, UA neg     Leukocytes, UA Negative Negative  CBC w/Diff     Status: None   Collection Time: 12/11/14  4:29 PM  Result Value Ref Range   WBC 9.0 4.0 - 10.5 K/uL   RBC 5.14 4.22 - 5.81 MIL/uL   Hemoglobin 16.4 13.0 - 17.0 g/dL   HCT 48.3 39.0 - 52.0 %   MCV 94.0 78.0 - 100.0 fL   MCH 31.9 26.0 - 34.0 pg   MCHC 34.0 30.0 - 36.0 g/dL   RDW 13.2 11.5 - 15.5 %   Platelets 160 150 - 400 K/uL   MPV 11.4 8.6 - 12.4 fL   Neutrophils Relative % 59 43 - 77 %   Neutro Abs 5.3 1.7 - 7.7 K/uL   Lymphocytes Relative 29 12 - 46 %   Lymphs Abs 2.6 0.7 - 4.0 K/uL   Monocytes Relative 6 3 - 12 %   Monocytes Absolute 0.5 0.1 - 1.0 K/uL   Eosinophils Relative 5 0 - 5 %   Eosinophils Absolute 0.5 0.0 - 0.7 K/uL   Basophils Relative 1 0 - 1 %   Basophils Absolute 0.1 0.0 - 0.1 K/uL   Smear Review Criteria for review not met   Comp Met (CMET)     Status: Abnormal   Collection Time: 12/11/14  4:29 PM  Result Value Ref Range   Sodium 135 135 - 145 mEq/L   Potassium 4.1 3.5 - 5.3 mEq/L   Chloride 97 96 - 112 mEq/L   CO2 21 19 - 32 mEq/L   Glucose, Bld 481 (H) 70 - 99 mg/dL   BUN 14 6 - 23 mg/dL   Creat 1.04 0.50 - 1.35 mg/dL   Total Bilirubin 1.1 0.2 - 1.2 mg/dL   Alkaline Phosphatase 131 (H) 39 - 117 U/L   AST 58 (H) 0 - 37 U/L   ALT 60 (H) 0 - 53 U/L   Total Protein 7.5 6.0 - 8.3 g/dL   Albumin 4.3 3.5 - 5.2 g/dL   Calcium 9.2 8.4 - 10.5 mg/dL  Hemoglobin A1c     Status: Abnormal   Collection Time: 12/11/14  4:29 PM  Result Value Ref Range   Hgb A1c MFr Bld 12.5 (H) <5.7 %    Comment:  According to the ADA Clinical Practice Recommendations for 2011, when HbA1c is used as a screening test:     >=6.5%   Diagnostic of Diabetes Mellitus            (if abnormal result is confirmed)   5.7-6.4%   Increased risk of developing Diabetes Mellitus   References:Diagnosis and Classification of Diabetes Mellitus,Diabetes ZOXW,9604,54(UJWJX  1):S62-S69 and Standards of Medical Care in         Diabetes - 2011,Diabetes BJYN,8295,62 (Suppl 1):S11-S61.      Mean Plasma Glucose 312 (H) <117 mg/dL  Testosterone     Status: Abnormal   Collection Time: 12/11/14  4:29 PM  Result Value Ref Range   Testosterone 130 (L) 300 - 890 ng/dL    Comment:           Tanner Stage       Male              Male               I              < 30 ng/dL        < 10 ng/dL               II             < 150 ng/dL       < 30 ng/dL               III            100-320 ng/dL     < 35 ng/dL               IV             200-970 ng/dL     15-40 ng/dL               V/Adult        300-890 ng/dL     10-70 ng/dL     TSH     Status: None   Collection Time: 12/11/14  4:29 PM  Result Value Ref Range   TSH 1.636 0.350 - 4.500 uIU/mL    Assessment/Plan: Essential hypertension, benign Will begin Lisinopril 10 mg daily. DASH diet discussed. Follow-up 1 month.  Diabetes mellitus type II, uncontrolled Tolerating Metformin. Will increase to 1000 mg AM and 500 mg PM for 1 week before increasing to 1000 mg BID. ACEI restarted. Patient has ordered glucometer. Demonstration for fasting blood sugar checks giving by nursing staff. Dietary recommendations given. Handout given to patient. Will follow-up 1 month. Goal is A1C < 7 and fasting sugars 80-120.

## 2014-12-19 NOTE — Assessment & Plan Note (Signed)
Will begin Lisinopril 10 mg daily. DASH diet discussed. Follow-up 1 month.

## 2014-12-25 ENCOUNTER — Other Ambulatory Visit (INDEPENDENT_AMBULATORY_CARE_PROVIDER_SITE_OTHER): Payer: BLUE CROSS/BLUE SHIELD

## 2014-12-25 DIAGNOSIS — R748 Abnormal levels of other serum enzymes: Secondary | ICD-10-CM

## 2014-12-25 LAB — COMPREHENSIVE METABOLIC PANEL
ALBUMIN: 4 g/dL (ref 3.5–5.2)
ALK PHOS: 76 U/L (ref 39–117)
ALT: 67 U/L — ABNORMAL HIGH (ref 0–53)
AST: 52 U/L — ABNORMAL HIGH (ref 0–37)
BUN: 20 mg/dL (ref 6–23)
CALCIUM: 9.4 mg/dL (ref 8.4–10.5)
CHLORIDE: 101 meq/L (ref 96–112)
CO2: 27 mEq/L (ref 19–32)
CREATININE: 0.96 mg/dL (ref 0.40–1.50)
GFR: 87.72 mL/min (ref >60.00)
Glucose, Bld: 195 mg/dL — ABNORMAL HIGH (ref 70–99)
Potassium: 4.3 mEq/L (ref 3.5–5.1)
Sodium: 137 mEq/L (ref 135–145)
TOTAL PROTEIN: 7 g/dL (ref 6.0–8.3)
Total Bilirubin: 1.2 mg/dL (ref 0.2–1.2)

## 2014-12-26 LAB — HM DIABETES EYE EXAM

## 2015-01-18 ENCOUNTER — Encounter: Payer: Self-pay | Admitting: Physician Assistant

## 2015-01-18 ENCOUNTER — Ambulatory Visit (INDEPENDENT_AMBULATORY_CARE_PROVIDER_SITE_OTHER): Payer: BLUE CROSS/BLUE SHIELD | Admitting: Physician Assistant

## 2015-01-18 VITALS — BP 146/90 | HR 63 | Temp 98.1°F | Resp 16 | Ht 73.0 in | Wt 301.4 lb

## 2015-01-18 DIAGNOSIS — IMO0002 Reserved for concepts with insufficient information to code with codable children: Secondary | ICD-10-CM

## 2015-01-18 DIAGNOSIS — I10 Essential (primary) hypertension: Secondary | ICD-10-CM | POA: Diagnosis not present

## 2015-01-18 DIAGNOSIS — Z23 Encounter for immunization: Secondary | ICD-10-CM

## 2015-01-18 DIAGNOSIS — E1165 Type 2 diabetes mellitus with hyperglycemia: Secondary | ICD-10-CM

## 2015-01-18 NOTE — Assessment & Plan Note (Signed)
Fasting CBG log reviewed -- averaging 100-140 which is great. Diabetic foot exam completed without abnormal findings. Will continue current medication regimen. Increase aerobic activity. Follow-up for labs after 03/13/15 for repeat BMP and A1C.

## 2015-01-18 NOTE — Patient Instructions (Signed)
Please continue medication regimen.  I am ok with giving the lisinopril another couple of months at current dose, provided you increase exercise. Continue Metformin 1000 mg once daily as directed. Again your goal blood sugar level is 80-100.   Follow-up after 03/13/15 for labs and BP check with nurse.

## 2015-01-18 NOTE — Assessment & Plan Note (Signed)
Above goal. Patient wishes to hold off increasing lisinopril until he can give exercise regimen a good effort. Will follow-up 2 months for BP recheck with nurse.

## 2015-01-18 NOTE — Progress Notes (Signed)
Patient presents to clinic today for follow-up of hypertension and DM II uncontrolled. We have been following up monthly x 2 months to try to get a quick hold on sugar levels. Patient is now up to 1000 mg of Metformin daily. Is watching diet and staying active. Fasting CBGs now averaging ~ 100-140. Is taking lisinopril 10 mg daily as directed. Patient denies chest pain, palpitations, lightheadedness, dizziness, vision changes or frequent headaches.  BP Readings from Last 3 Encounters:  01/18/15 146/90  12/18/14 158/90  12/11/14 142/90    Past Medical History  Diagnosis Date  . HTN (hypertension)   . Gout   . Hx of diabetes mellitus     Resolved  . Chicken pox   . Skin lesion     Benign    Current Outpatient Prescriptions on File Prior to Visit  Medication Sig Dispense Refill  . allopurinol (ZYLOPRIM) 300 MG tablet Take 300 mg by mouth daily as needed.    Marland Kitchen aspirin 81 MG tablet Take 81 mg by mouth daily.    . Chromium Picolinate 500 MCG CAPS Take 1 capsule by mouth daily.    Marland Kitchen lisinopril (PRINIVIL,ZESTRIL) 10 MG tablet Take 1 tablet (10 mg total) by mouth daily. 90 tablet 1  . metFORMIN (GLUCOPHAGE) 1000 MG tablet Take 1 tablet by mouth once daily for a week, then increase to 1050m twice daily. 60 tablet 1   No current facility-administered medications on file prior to visit.    Allergies  Allergen Reactions  . Penicillins Anaphylaxis    Family History  Problem Relation Age of Onset  . Heart disease Father 539   Deceased  . Heart attack Father   . Hypertension Father   . Diabetes Father   . Arthritis-Osteo Mother     Living  . Colon cancer Mother   . Stroke Mother   . Hypertension Mother   . Breast cancer Mother   . Asthma Mother   . Heart disease Paternal Grandfather   . Heart attack Paternal Grandfather   . Heart attack Maternal Grandmother   . Healthy Sister   . Healthy Brother   . Asthma Son     x2  . Asthma Maternal Grandfather     Social History    Social History  . Marital Status: Divorced    Spouse Name: N/A  . Number of Children: N/A  . Years of Education: N/A   Social History Main Topics  . Smoking status: Former Smoker    Quit date: 05/14/2014  . Smokeless tobacco: Never Used  . Alcohol Use: 4.2 oz/week    7 Standard drinks or equivalent per week  . Drug Use: No  . Sexual Activity: Yes   Other Topics Concern  . None   Social History Narrative    Review of Systems - See HPI.  All other ROS are negative.  BP 146/90 mmHg  Pulse 63  Temp(Src) 98.1 F (36.7 C) (Oral)  Resp 16  Ht 6' 1"  (1.854 m)  Wt 301 lb 6 oz (136.703 kg)  BMI 39.77 kg/m2  SpO2 98%  Physical Exam  Constitutional: He is oriented to person, place, and time and well-developed, well-nourished, and in no distress.  HENT:  Head: Normocephalic and atraumatic.  Eyes: Conjunctivae are normal.  Cardiovascular: Normal rate, regular rhythm, normal heart sounds and intact distal pulses.   Pulmonary/Chest: Effort normal and breath sounds normal. No respiratory distress. He has no wheezes. He has no rales. He exhibits no tenderness.  Neurological: He is alert and oriented to person, place, and time.  Skin: Skin is warm and dry. No rash noted.  Psychiatric: Affect normal.  Vitals reviewed.  Diabetic Foot Form - Detailed   Diabetic Foot Exam - detailed  Diabetic Foot exam was performed with the following findings:  Yes 01/18/2015  2:43 PM  Visual Foot Exam completed.:  Yes  Is there a history of foot ulcer?:  No  Can the patient see the bottom of their feet?:  Yes  Are the shoes appropriate in style and fit?:  Yes  Is there swelling or and abnormal foot shape?:  No  Are the toenails long?:  No  Are the toenails thick?:  No  Do you have pain in calf while walking?:  No  Is there a claw toe deformity?:  No  Is there elevated skin temparature?:  No  Is there limited skin dorsiflexion?:  No  Is there foot or ankle muscle weakness?:  No  Are the  toenails ingrown?:  No  Normal Range of Motion:  Yes    Pulse Foot Exam completed.:  Yes  Right posterior Tibialias:  Present Left posterior Tibialias:  Present  Right Dorsalis Pedis:  Present Left Dorsalis Pedis:  Present  Sensory Foot Exam Completed.:  Yes  Swelling:  No  Semmes-Weinstein Monofilament Test  R Foot Test Control:  Neg L Foot Test Control:  Neg  R Site 1-Great Toe:  Neg L Site 1-Great Toe:  Neg  R Site 4:  Neg L Site 4:  Neg  R Site 5:  Neg L Site 5:  Neg         Recent Results (from the past 2160 hour(s))  Urine cytology ancillary only     Status: None   Collection Time: 12/11/14 12:00 AM  Result Value Ref Range   Chlamydia Negative     Comment: Normal Reference Range - Negative   Neisseria gonorrhea Negative     Comment: Normal Reference Range - Negative  Urine cytology ancillary only     Status: None   Collection Time: 12/11/14 12:00 AM  Result Value Ref Range   Candida vaginitis Negative for Candida Vaginitis Microorganisms     Comment: Normal Reference Range - Negative  POCT urinalysis dipstick     Status: None   Collection Time: 12/11/14  4:11 PM  Result Value Ref Range   Color, UA yellow    Clarity, UA clear    Glucose, UA 3+    Bilirubin, UA neg    Ketones, UA neg    Spec Grav, UA 1.015    Blood, UA neg    pH, UA 6.0    Protein, UA neg    Urobilinogen, UA 0.2    Nitrite, UA neg    Leukocytes, UA Negative Negative  CBC w/Diff     Status: None   Collection Time: 12/11/14  4:29 PM  Result Value Ref Range   WBC 9.0 4.0 - 10.5 K/uL   RBC 5.14 4.22 - 5.81 MIL/uL   Hemoglobin 16.4 13.0 - 17.0 g/dL   HCT 48.3 39.0 - 52.0 %   MCV 94.0 78.0 - 100.0 fL   MCH 31.9 26.0 - 34.0 pg   MCHC 34.0 30.0 - 36.0 g/dL   RDW 13.2 11.5 - 15.5 %   Platelets 160 150 - 400 K/uL   MPV 11.4 8.6 - 12.4 fL   Neutrophils Relative % 59 43 - 77 %   Neutro Abs 5.3 1.7 -  7.7 K/uL   Lymphocytes Relative 29 12 - 46 %   Lymphs Abs 2.6 0.7 - 4.0 K/uL   Monocytes Relative  6 3 - 12 %   Monocytes Absolute 0.5 0.1 - 1.0 K/uL   Eosinophils Relative 5 0 - 5 %   Eosinophils Absolute 0.5 0.0 - 0.7 K/uL   Basophils Relative 1 0 - 1 %   Basophils Absolute 0.1 0.0 - 0.1 K/uL   Smear Review Criteria for review not met   Comp Met (CMET)     Status: Abnormal   Collection Time: 12/11/14  4:29 PM  Result Value Ref Range   Sodium 135 135 - 145 mEq/L   Potassium 4.1 3.5 - 5.3 mEq/L   Chloride 97 96 - 112 mEq/L   CO2 21 19 - 32 mEq/L   Glucose, Bld 481 (H) 70 - 99 mg/dL   BUN 14 6 - 23 mg/dL   Creat 1.04 0.50 - 1.35 mg/dL   Total Bilirubin 1.1 0.2 - 1.2 mg/dL   Alkaline Phosphatase 131 (H) 39 - 117 U/L   AST 58 (H) 0 - 37 U/L   ALT 60 (H) 0 - 53 U/L   Total Protein 7.5 6.0 - 8.3 g/dL   Albumin 4.3 3.5 - 5.2 g/dL   Calcium 9.2 8.4 - 10.5 mg/dL  Hemoglobin A1c     Status: Abnormal   Collection Time: 12/11/14  4:29 PM  Result Value Ref Range   Hgb A1c MFr Bld 12.5 (H) <5.7 %    Comment:                                                                        According to the ADA Clinical Practice Recommendations for 2011, when HbA1c is used as a screening test:     >=6.5%   Diagnostic of Diabetes Mellitus            (if abnormal result is confirmed)   5.7-6.4%   Increased risk of developing Diabetes Mellitus   References:Diagnosis and Classification of Diabetes Mellitus,Diabetes Care,2011,34(Suppl 1):S62-S69 and Standards of Medical Care in         Diabetes - 2011,Diabetes Care,2011,34 (Suppl 1):S11-S61.      Mean Plasma Glucose 312 (H) <117 mg/dL  Testosterone     Status: Abnormal   Collection Time: 12/11/14  4:29 PM  Result Value Ref Range   Testosterone 130 (L) 300 - 890 ng/dL    Comment:           Tanner Stage       Male              Male               I              < 30 ng/dL        < 10 ng/dL               II             < 150 ng/dL       < 30 ng/dL               III  100-320 ng/dL     < 35 ng/dL               IV             200-970  ng/dL     15-40 ng/dL               V/Adult        300-890 ng/dL     10-70 ng/dL     TSH     Status: None   Collection Time: 12/11/14  4:29 PM  Result Value Ref Range   TSH 1.636 0.350 - 4.500 uIU/mL  Comp Met (CMET)     Status: Abnormal   Collection Time: 12/25/14  9:03 AM  Result Value Ref Range   Sodium 137 135 - 145 mEq/L   Potassium 4.3 3.5 - 5.1 mEq/L   Chloride 101 96 - 112 mEq/L   CO2 27 19 - 32 mEq/L   Glucose, Bld 195 (H) 70 - 99 mg/dL   BUN 20 6 - 23 mg/dL   Creatinine, Ser 0.96 0.40 - 1.50 mg/dL   Total Bilirubin 1.2 0.2 - 1.2 mg/dL   Alkaline Phosphatase 76 39 - 117 U/L   AST 52 (H) 0 - 37 U/L   ALT 67 (H) 0 - 53 U/L   Total Protein 7.0 6.0 - 8.3 g/dL   Albumin 4.0 3.5 - 5.2 g/dL   Calcium 9.4 8.4 - 10.5 mg/dL   GFR 87.72 >60.00 mL/min  HM DIABETES EYE EXAM     Status: None   Collection Time: 12/26/14 12:00 AM  Result Value Ref Range   HM Diabetic Eye Exam No Retinopathy No Retinopathy    Assessment/Plan: Diabetes mellitus type II, uncontrolled Fasting CBG log reviewed -- averaging 100-140 which is great. Diabetic foot exam completed without abnormal findings. Will continue current medication regimen. Increase aerobic activity. Follow-up for labs after 03/13/15 for repeat BMP and A1C.  Essential hypertension, benign Above goal. Patient wishes to hold off increasing lisinopril until he can give exercise regimen a good effort. Will follow-up 2 months for BP recheck with nurse.

## 2015-01-18 NOTE — Addendum Note (Signed)
Addended by: Rockwell Germany on: 01/18/2015 03:23 PM   Modules accepted: Orders

## 2015-01-18 NOTE — Progress Notes (Signed)
Pre visit review using our clinic review tool, if applicable. No additional management support is needed unless otherwise documented below in the visit note/SLS  

## 2015-02-19 ENCOUNTER — Other Ambulatory Visit: Payer: Self-pay | Admitting: Physician Assistant

## 2015-02-26 ENCOUNTER — Ambulatory Visit (INDEPENDENT_AMBULATORY_CARE_PROVIDER_SITE_OTHER): Payer: BLUE CROSS/BLUE SHIELD | Admitting: Physician Assistant

## 2015-02-26 ENCOUNTER — Encounter: Payer: Self-pay | Admitting: Physician Assistant

## 2015-02-26 VITALS — BP 140/71 | HR 67 | Temp 98.1°F | Resp 16 | Ht 73.0 in | Wt 299.5 lb

## 2015-02-26 DIAGNOSIS — J019 Acute sinusitis, unspecified: Secondary | ICD-10-CM | POA: Diagnosis not present

## 2015-02-26 DIAGNOSIS — B9689 Other specified bacterial agents as the cause of diseases classified elsewhere: Secondary | ICD-10-CM

## 2015-02-26 MED ORDER — DOXYCYCLINE HYCLATE 100 MG PO CAPS: 100.0000 mg | ORAL_CAPSULE | Freq: Two times a day (BID) | ORAL | Status: DC

## 2015-02-26 NOTE — Patient Instructions (Signed)
Please take antibiotic (Doxycycline) as directed.  Increase fluid intake.  Use Saline nasal spray.  Take a daily multivitamin. Use Coridicen for symptomatic relief.  Place a humidifier in the bedroom.  Please call or return clinic if symptoms are not improving.  Sinusitis Sinusitis is redness, soreness, and swelling (inflammation) of the paranasal sinuses. Paranasal sinuses are air pockets within the bones of your face (beneath the eyes, the middle of the forehead, or above the eyes). In healthy paranasal sinuses, mucus is able to drain out, and air is able to circulate through them by way of your nose. However, when your paranasal sinuses are inflamed, mucus and air can become trapped. This can allow bacteria and other germs to grow and cause infection. Sinusitis can develop quickly and last only a short time (acute) or continue over a long period (chronic). Sinusitis that lasts for more than 12 weeks is considered chronic.  CAUSES  Causes of sinusitis include:  Allergies.  Structural abnormalities, such as displacement of the cartilage that separates your nostrils (deviated septum), which can decrease the air flow through your nose and sinuses and affect sinus drainage.  Functional abnormalities, such as when the small hairs (cilia) that line your sinuses and help remove mucus do not work properly or are not present. SYMPTOMS  Symptoms of acute and chronic sinusitis are the same. The primary symptoms are pain and pressure around the affected sinuses. Other symptoms include:  Upper toothache.  Earache.  Headache.  Bad breath.  Decreased sense of smell and taste.  A cough, which worsens when you are lying flat.  Fatigue.  Fever.  Thick drainage from your nose, which often is green and may contain pus (purulent).  Swelling and warmth over the affected sinuses. DIAGNOSIS  Your caregiver will perform a physical exam. During the exam, your caregiver may:  Look in your nose for signs  of abnormal growths in your nostrils (nasal polyps).  Tap over the affected sinus to check for signs of infection.  View the inside of your sinuses (endoscopy) with a special imaging device with a light attached (endoscope), which is inserted into your sinuses. If your caregiver suspects that you have chronic sinusitis, one or more of the following tests may be recommended:  Allergy tests.  Nasal culture A sample of mucus is taken from your nose and sent to a lab and screened for bacteria.  Nasal cytology A sample of mucus is taken from your nose and examined by your caregiver to determine if your sinusitis is related to an allergy. TREATMENT  Most cases of acute sinusitis are related to a viral infection and will resolve on their own within 10 days. Sometimes medicines are prescribed to help relieve symptoms (pain medicine, decongestants, nasal steroid sprays, or saline sprays).  However, for sinusitis related to a bacterial infection, your caregiver will prescribe antibiotic medicines. These are medicines that will help kill the bacteria causing the infection.  Rarely, sinusitis is caused by a fungal infection. In theses cases, your caregiver will prescribe antifungal medicine. For some cases of chronic sinusitis, surgery is needed. Generally, these are cases in which sinusitis recurs more than 3 times per year, despite other treatments. HOME CARE INSTRUCTIONS   Drink plenty of water. Water helps thin the mucus so your sinuses can drain more easily.  Use a humidifier.  Inhale steam 3 to 4 times a day (for example, sit in the bathroom with the shower running).  Apply a warm, moist washcloth to your face  3 to 4 times a day, or as directed by your caregiver.  Use saline nasal sprays to help moisten and clean your sinuses.  Take over-the-counter or prescription medicines for pain, discomfort, or fever only as directed by your caregiver. SEEK IMMEDIATE MEDICAL CARE IF:  You have  increasing pain or severe headaches.  You have nausea, vomiting, or drowsiness.  You have swelling around your face.  You have vision problems.  You have a stiff neck.  You have difficulty breathing. MAKE SURE YOU:   Understand these instructions.  Will watch your condition.  Will get help right away if you are not doing well or get worse. Document Released: 05/08/2005 Document Revised: 07/31/2011 Document Reviewed: 05/23/2011 [facility] Patient Information 2014 [place], [place].

## 2015-02-26 NOTE — Progress Notes (Signed)
Pre visit review using our clinic review tool, if applicable. No additional management support is needed unless otherwise documented below in the visit note/SLS  

## 2015-02-26 NOTE — Progress Notes (Signed)
Patient presents to clinic today c/o 3 weeks of sinus pressure, sinus pain, chest congestion, ear pressure, PND and sore throat. Now with productive cough from drainage. Denies recent travel or sick contact.    Past Medical History  Diagnosis Date  . HTN (hypertension)   . Gout   . Hx of diabetes mellitus     Resolved  . Chicken pox   . Skin lesion     Benign    Current Outpatient Prescriptions on File Prior to Visit  Medication Sig Dispense Refill  . allopurinol (ZYLOPRIM) 300 MG tablet Take 300 mg by mouth daily as needed.    Marland Kitchen aspirin 81 MG tablet Take 81 mg by mouth daily.    . Chromium Picolinate 500 MCG CAPS Take 1 capsule by mouth daily.    Marland Kitchen lisinopril (PRINIVIL,ZESTRIL) 10 MG tablet Take 1 tablet (10 mg total) by mouth daily. 90 tablet 1  . metFORMIN (GLUCOPHAGE) 1000 MG tablet TAKE 1 TABLET BY MOUTH ONCE DAILY FOR 1 WEEK, THEN INCREASE TO 1 TABLET TWICE DAILY. (Patient taking differently: TAKE 1 TABLET BY MOUTH ONCE DAILY) 60 tablet 5   No current facility-administered medications on file prior to visit.    Allergies  Allergen Reactions  . Penicillins Anaphylaxis    Family History  Problem Relation Age of Onset  . Heart disease Father 7    Deceased  . Heart attack Father   . Hypertension Father   . Diabetes Father   . Arthritis-Osteo Mother     Living  . Colon cancer Mother   . Stroke Mother   . Hypertension Mother   . Breast cancer Mother   . Asthma Mother   . Heart disease Paternal Grandfather   . Heart attack Paternal Grandfather   . Heart attack Maternal Grandmother   . Healthy Sister   . Healthy Brother   . Asthma Son     x2  . Asthma Maternal Grandfather     Social History   Social History  . Marital Status: Divorced    Spouse Name: N/A  . Number of Children: N/A  . Years of Education: N/A   Social History Main Topics  . Smoking status: Former Smoker    Quit date: 05/14/2014  . Smokeless tobacco: Never Used  . Alcohol Use: 4.2  oz/week    7 Standard drinks or equivalent per week  . Drug Use: No  . Sexual Activity: Yes   Other Topics Concern  . None   Social History Narrative    Review of Systems - See HPI.  All other ROS are negative.  BP 140/71 mmHg  Pulse 67  Temp(Src) 98.1 F (36.7 C) (Oral)  Resp 16  Ht _0  (1.854 m)  Wt 299 lb 8 oz (135.852 kg)  BMI 39.52 kg/m2  Physical Exam  Constitutional: He is oriented to person, place, and time and well-developed, well-nourished, and in no distress.  HENT:  Head: Normocephalic and atraumatic.  Right Ear: External ear normal.  Left Ear: External ear normal.  Nose: Nose normal.  Mouth/Throat: Oropharynx is clear and moist.  + TTP of sinuses.  Eyes: Conjunctivae are normal.  Neck: Neck supple.  Cardiovascular: Normal rate, regular rhythm, normal heart sounds and intact distal pulses.   Pulmonary/Chest: Effort normal and breath sounds normal. No respiratory distress. He has no wheezes. He has no rales. He exhibits no tenderness.  Neurological: He is alert and oriented to person, place, and time.  Skin: Skin is  warm and dry. No rash noted.  Psychiatric: Affect normal.  Vitals reviewed.   Recent Results (from the past 2160 hour(s))  Urine cytology ancillary only     Status: None   Collection Time: 12/11/14 12:00 AM  Result Value Ref Range   Chlamydia Negative     Comment: Normal Reference Range - Negative   Neisseria gonorrhea Negative     Comment: Normal Reference Range - Negative  Urine cytology ancillary only     Status: None   Collection Time: 12/11/14 12:00 AM  Result Value Ref Range   Candida vaginitis Negative for Candida Vaginitis Microorganisms     Comment: Normal Reference Range - Negative  POCT urinalysis dipstick     Status: None   Collection Time: 12/11/14  4:11 PM  Result Value Ref Range   Color, UA yellow    Clarity, UA clear    Glucose, UA 3+    Bilirubin, UA neg    Ketones, UA neg    Spec Grav, UA 1.015    Blood, UA neg     pH, UA 6.0    Protein, UA neg    Urobilinogen, UA 0.2    Nitrite, UA neg    Leukocytes, UA Negative Negative  CBC w/Diff     Status: None   Collection Time: 12/11/14  4:29 PM  Result Value Ref Range   WBC 9.0 4.0 - 10.5 K/uL   RBC 5.14 4.22 - 5.81 MIL/uL   Hemoglobin 16.4 13.0 - 17.0 g/dL   HCT 48.3 39.0 - 52.0 %   MCV 94.0 78.0 - 100.0 fL   MCH 31.9 26.0 - 34.0 pg   MCHC 34.0 30.0 - 36.0 g/dL   RDW 13.2 11.5 - 15.5 %   Platelets 160 150 - 400 K/uL   MPV 11.4 8.6 - 12.4 fL   Neutrophils Relative % 59 43 - 77 %   Neutro Abs 5.3 1.7 - 7.7 K/uL   Lymphocytes Relative 29 12 - 46 %   Lymphs Abs 2.6 0.7 - 4.0 K/uL   Monocytes Relative 6 3 - 12 %   Monocytes Absolute 0.5 0.1 - 1.0 K/uL   Eosinophils Relative 5 0 - 5 %   Eosinophils Absolute 0.5 0.0 - 0.7 K/uL   Basophils Relative 1 0 - 1 %   Basophils Absolute 0.1 0.0 - 0.1 K/uL   Smear Review Criteria for review not met   Comp Met (CMET)     Status: Abnormal   Collection Time: 12/11/14  4:29 PM  Result Value Ref Range   Sodium 135 135 - 145 mEq/L   Potassium 4.1 3.5 - 5.3 mEq/L   Chloride 97 96 - 112 mEq/L   CO2 21 19 - 32 mEq/L   Glucose, Bld 481 (H) 70 - 99 mg/dL   BUN 14 6 - 23 mg/dL   Creat 1.04 0.50 - 1.35 mg/dL   Total Bilirubin 1.1 0.2 - 1.2 mg/dL   Alkaline Phosphatase 131 (H) 39 - 117 U/L   AST 58 (H) 0 - 37 U/L   ALT 60 (H) 0 - 53 U/L   Total Protein 7.5 6.0 - 8.3 g/dL   Albumin 4.3 3.5 - 5.2 g/dL   Calcium 9.2 8.4 - 10.5 mg/dL  Hemoglobin A1c     Status: Abnormal   Collection Time: 12/11/14  4:29 PM  Result Value Ref Range   Hgb A1c MFr Bld 12.5 (H) <5.7 %    Comment:  According to the ADA Clinical Practice Recommendations for 2011, when HbA1c is used as a screening test:     >=6.5%   Diagnostic of Diabetes Mellitus            (if abnormal result is confirmed)   5.7-6.4%   Increased risk of developing Diabetes Mellitus     References:Diagnosis and Classification of Diabetes Mellitus,Diabetes ZOXW,9604,54(UJWJX 1):S62-S69 and Standards of Medical Care in         Diabetes - 2011,Diabetes BJYN,8295,62 (Suppl 1):S11-S61.      Mean Plasma Glucose 312 (H) <117 mg/dL  Testosterone     Status: Abnormal   Collection Time: 12/11/14  4:29 PM  Result Value Ref Range   Testosterone 130 (L) 300 - 890 ng/dL    Comment:           Tanner Stage       Male              Male               I              < 30 ng/dL        < 10 ng/dL               II             < 150 ng/dL       < 30 ng/dL               III            100-320 ng/dL     < 35 ng/dL               IV             200-970 ng/dL     15-40 ng/dL               V/Adult        300-890 ng/dL     10-70 ng/dL     TSH     Status: None   Collection Time: 12/11/14  4:29 PM  Result Value Ref Range   TSH 1.636 0.350 - 4.500 uIU/mL  Comp Met (CMET)     Status: Abnormal   Collection Time: 12/25/14  9:03 AM  Result Value Ref Range   Sodium 137 135 - 145 mEq/L   Potassium 4.3 3.5 - 5.1 mEq/L   Chloride 101 96 - 112 mEq/L   CO2 27 19 - 32 mEq/L   Glucose, Bld 195 (H) 70 - 99 mg/dL   BUN 20 6 - 23 mg/dL   Creatinine, Ser 0.96 0.40 - 1.50 mg/dL   Total Bilirubin 1.2 0.2 - 1.2 mg/dL   Alkaline Phosphatase 76 39 - 117 U/L   AST 52 (H) 0 - 37 U/L   ALT 67 (H) 0 - 53 U/L   Total Protein 7.0 6.0 - 8.3 g/dL   Albumin 4.0 3.5 - 5.2 g/dL   Calcium 9.4 8.4 - 10.5 mg/dL   GFR 87.72 >60.00 mL/min  HM DIABETES EYE EXAM     Status: None   Collection Time: 12/26/14 12:00 AM  Result Value Ref Range   HM Diabetic Eye Exam No Retinopathy No Retinopathy    Assessment/Plan: Acute bacterial sinusitis Rx Doxycycline as patient penicillin-allergic.  Increase fluids.  Rest.  Saline nasal spray.  Probiotic.  Mucinex as directed.  Humidifier in bedroom.  Call or return to clinic if symptoms are  not improving.

## 2015-03-01 DIAGNOSIS — B9689 Other specified bacterial agents as the cause of diseases classified elsewhere: Secondary | ICD-10-CM | POA: Insufficient documentation

## 2015-03-01 DIAGNOSIS — J019 Acute sinusitis, unspecified: Principal | ICD-10-CM

## 2015-03-01 NOTE — Assessment & Plan Note (Signed)
Rx Doxycycline as patient penicillin-allergic.  Increase fluids.  Rest.  Saline nasal spray.  Probiotic.  Mucinex as directed.  Humidifier in bedroom.  Call or return to clinic if symptoms are not improving.

## 2015-03-15 ENCOUNTER — Other Ambulatory Visit: Payer: Self-pay | Admitting: Physician Assistant

## 2015-03-15 MED ORDER — METFORMIN HCL 1000 MG PO TABS: 1000.0000 mg | ORAL_TABLET | Freq: Two times a day (BID) | ORAL | Status: DC

## 2015-03-16 ENCOUNTER — Other Ambulatory Visit (INDEPENDENT_AMBULATORY_CARE_PROVIDER_SITE_OTHER): Payer: BLUE CROSS/BLUE SHIELD

## 2015-03-16 ENCOUNTER — Ambulatory Visit (INDEPENDENT_AMBULATORY_CARE_PROVIDER_SITE_OTHER): Payer: BLUE CROSS/BLUE SHIELD | Admitting: Physician Assistant

## 2015-03-16 ENCOUNTER — Encounter: Payer: Self-pay | Admitting: Physician Assistant

## 2015-03-16 VITALS — BP 154/94 | HR 73

## 2015-03-16 DIAGNOSIS — I10 Essential (primary) hypertension: Secondary | ICD-10-CM

## 2015-03-16 DIAGNOSIS — E1310 Other specified diabetes mellitus with ketoacidosis without coma: Secondary | ICD-10-CM | POA: Diagnosis not present

## 2015-03-16 LAB — BASIC METABOLIC PANEL
BUN: 16 mg/dL (ref 6–23)
CO2: 26 mEq/L (ref 19–32)
Calcium: 9.3 mg/dL (ref 8.4–10.5)
Chloride: 106 mEq/L (ref 96–112)
Creatinine, Ser: 0.82 mg/dL (ref 0.40–1.50)
GFR: 105.12 mL/min (ref >60.00)
GLUCOSE: 129 mg/dL — AB (ref 70–99)
POTASSIUM: 3.8 meq/L (ref 3.5–5.1)
Sodium: 139 mEq/L (ref 135–145)

## 2015-03-16 LAB — HEMOGLOBIN A1C: Hgb A1c MFr Bld: 7.5 % — ABNORMAL HIGH (ref 4.6–6.5)

## 2015-03-16 MED ORDER — LISINOPRIL 20 MG PO TABS: 20.0000 mg | ORAL_TABLET | Freq: Every day | ORAL | Status: DC

## 2015-03-16 NOTE — Progress Notes (Signed)
Pre visit review using our clinic review tool, if applicable. No additional management support is needed unless otherwise documented below in the visit note.  Patient in for BP check.

## 2015-03-16 NOTE — Addendum Note (Signed)
Addended by: Bunnie Domino on: 03/16/2015 12:08 PM   Modules accepted: Orders

## 2015-03-16 NOTE — Progress Notes (Signed)
Patient seen by LPN today for nurse visit for BP recheck. BP continues to be elevated despite 10 mg Lisinopril and TLC.   BP Readings from Last 3 Encounters:  03/16/15 154/94  02/26/15 140/71  01/18/15 146/90   LPN instructed to inform patient that lisinopril will be increased to 20 mg daily. Will follow-up 1 month for HTN and DM.

## 2015-04-30 ENCOUNTER — Telehealth: Payer: Self-pay | Admitting: Physician Assistant

## 2015-04-30 NOTE — Telephone Encounter (Signed)
msg

## 2015-05-10 ENCOUNTER — Other Ambulatory Visit: Payer: Self-pay | Admitting: Physician Assistant

## 2015-05-10 DIAGNOSIS — I10 Essential (primary) hypertension: Secondary | ICD-10-CM

## 2015-05-10 MED ORDER — LISINOPRIL 10 MG PO TABS: 10.0000 mg | ORAL_TABLET | Freq: Every day | ORAL | Status: DC

## 2015-05-20 ENCOUNTER — Telehealth: Payer: Self-pay | Admitting: Physician Assistant

## 2015-06-02 ENCOUNTER — Telehealth: Payer: Self-pay | Admitting: *Deleted

## 2015-06-02 NOTE — Telephone Encounter (Signed)
Called and [facility] @ 9:49am @ [phone]) asking the pt to RTC regarding blood pressure dose verification.//AB/CMA

## 2015-06-03 NOTE — Telephone Encounter (Signed)
Called and [facility] @ 9:00am @ [phone]) asking the pt to RTC regarding medication verification.//AB/CMA

## 2015-06-04 NOTE — Telephone Encounter (Signed)
Wife [name] called back. Please call before you leave today if possible at [phone]

## 2015-06-04 NOTE — Telephone Encounter (Addendum)
Called and spoke with the pt's wife [name]) regarding the pt's BP medication.  Informed the wife that we are needing to know what dose of the Lisinopril is the pt taking.  She stated that she feels he is taking the 10mg  tablet.  Informed her that at the pt's last office visit he was informed to increase the dose to 20mg , which a new prescription was sent to the pharmacy.  She stated that she was no sure, so she will get in contact with the pt and find out.  She called back and stated that the pt stated that he was taking the 10mg  tablet daily.  Informed the wife that we received a refill request for the Lisinopril 20mg , but in the chart a prescription was sent to the pharmacy for 10mg  tablet.  Informed the wife I don't know where the confusion came from but the pt should be on the 20mg  tablet per [place].  Informed the wife that when the pt was seen in Oct he was changed to Lisinopril 20mg  daily and asked to follow up in 1 month for BP and DM.  Per Cody-he would like for the pt to continue the Lisinopril 10mg  and he needs to schedule an appt for a follow-up.  She verbalized understanding and agreed and scheduled an appt for the pt for (Friday-06-18-15 @ 4:00pm).//AB/CMA

## 2015-06-11 NOTE — Telephone Encounter (Signed)
error 

## 2015-06-18 ENCOUNTER — Ambulatory Visit (INDEPENDENT_AMBULATORY_CARE_PROVIDER_SITE_OTHER): Payer: BLUE CROSS/BLUE SHIELD | Admitting: Physician Assistant

## 2015-06-18 ENCOUNTER — Encounter: Payer: Self-pay | Admitting: Physician Assistant

## 2015-06-18 VITALS — BP 150/94 | HR 85 | Temp 98.1°F | Ht 73.0 in | Wt 298.5 lb

## 2015-06-18 DIAGNOSIS — I10 Essential (primary) hypertension: Secondary | ICD-10-CM | POA: Diagnosis not present

## 2015-06-18 MED ORDER — FLUTICASONE PROPIONATE 50 MCG/ACT NA SUSP: 2.0000 | Freq: Every day | NASAL | Status: DC

## 2015-06-18 MED ORDER — LOSARTAN POTASSIUM-HCTZ 50-12.5 MG PO TABS: 1.0000 | ORAL_TABLET | Freq: Every day | ORAL | Status: DC

## 2015-06-18 NOTE — Patient Instructions (Signed)
Please stop the lisinopril. Start the losartan-HCTZ as directed. Increase fluids.  Use the Flonase as directed. Take a Zyrtec at bedtime. Use saline nasal spray to flush out sinuses.   Follow-up in 2 weeks

## 2015-06-20 NOTE — Progress Notes (Signed)
Patient presents to clinic today for follow-up of hypertension. Patient prescribed 20 mg lisinopril daily. There was a discrepancy at the pharmacy and patient was given 10 mg tablet to take. Has been taking as directed. Notes BP is not improved. Patient denies chest pain, palpitations, lightheadedness, dizziness, vision changes or frequent headaches.  BP Readings from Last 3 Encounters:  06/18/15 150/94  03/16/15 154/94  02/26/15 140/71    Endorses a constant dry cough since start of medication that has not improved. Does note occasional PND that he feels worsens the cough. Denies fever, chills, malaise or fatigue.   Past Medical History  Diagnosis Date  . HTN (hypertension)   . Gout   . Hx of diabetes mellitus     Resolved  . Chicken pox   . Skin lesion     Benign    Current Outpatient Prescriptions on File Prior to Visit  Medication Sig Dispense Refill  . aspirin 81 MG tablet Take 81 mg by mouth daily.    . Chromium Picolinate 500 MCG CAPS Take 1 capsule by mouth daily.    . metFORMIN (GLUCOPHAGE) 1000 MG tablet Take 1 tablet (1,000 mg total) by mouth 2 (two) times daily with a meal. 60 tablet 5   No current facility-administered medications on file prior to visit.    Allergies  Allergen Reactions  . Penicillins Anaphylaxis    Family History  Problem Relation Age of Onset  . Heart disease Father 76    Deceased  . Heart attack Father   . Hypertension Father   . Diabetes Father   . Arthritis-Osteo Mother     Living  . Colon cancer Mother   . Stroke Mother   . Hypertension Mother   . Breast cancer Mother   . Asthma Mother   . Heart disease Paternal Grandfather   . Heart attack Paternal Grandfather   . Heart attack Maternal Grandmother   . Healthy Sister   . Healthy Brother   . Asthma Son     x2  . Asthma Maternal Grandfather     Social History   Social History  . Marital Status: Divorced    Spouse Name: N/A  . Number of Children: N/A  . Years of  Education: N/A   Social History Main Topics  . Smoking status: Former Smoker    Quit date: 05/14/2014  . Smokeless tobacco: Never Used  . Alcohol Use: 4.2 oz/week    7 Standard drinks or equivalent per week  . Drug Use: No  . Sexual Activity: Yes   Other Topics Concern  . None   Social History Narrative    Review of Systems - See HPI.  All other ROS are negative.  BP 150/94 mmHg  Pulse 85  Temp(Src) 98.1 F (36.7 C) (Oral)  Ht 6\' 1"  (1.854 m)  Wt 298 lb 8 oz (135.399 kg)  BMI 39.39 kg/m2  SpO2 96%  Physical Exam  Constitutional: He is oriented to person, place, and time and well-developed, well-nourished, and in no distress.  HENT:  Head: Normocephalic and atraumatic.  Eyes: Conjunctivae are normal.  Neck: Neck supple.  Cardiovascular: Normal rate, regular rhythm, normal heart sounds and intact distal pulses.   Pulmonary/Chest: Effort normal and breath sounds normal. No respiratory distress. He has no wheezes. He has no rales. He exhibits no tenderness.  Neurological: He is alert and oriented to person, place, and time.  Skin: Skin is warm and dry. No rash noted.  Psychiatric: Affect  normal.  Vitals reviewed.  Assessment/Plan: Essential hypertension, benign Will stop lisinopril due to cough. Now that formulary has changed. Losartan-HCTZ is a preferred medication. Will start at 50-12.5 dose daily. Increase fluids. DASH diet encouraged. Follow-up as scheduled.

## 2015-06-20 NOTE — Assessment & Plan Note (Addendum)
Will stop lisinopril due to cough. Now that formulary has changed. Losartan-HCTZ is a preferred medication. Will start at 50-12.5 dose daily. Increase fluids. DASH diet encouraged. Follow-up as scheduled.

## 2015-07-05 ENCOUNTER — Ambulatory Visit: Payer: BLUE CROSS/BLUE SHIELD | Admitting: Physician Assistant

## 2015-07-07 ENCOUNTER — Ambulatory Visit: Payer: BLUE CROSS/BLUE SHIELD | Admitting: Physician Assistant

## 2015-07-13 ENCOUNTER — Ambulatory Visit (INDEPENDENT_AMBULATORY_CARE_PROVIDER_SITE_OTHER): Payer: BLUE CROSS/BLUE SHIELD | Admitting: Physician Assistant

## 2015-07-13 ENCOUNTER — Encounter: Payer: Self-pay | Admitting: Physician Assistant

## 2015-07-13 VITALS — BP 153/93 | HR 84 | Temp 98.2°F | Ht 73.0 in | Wt 300.0 lb

## 2015-07-13 DIAGNOSIS — I10 Essential (primary) hypertension: Secondary | ICD-10-CM | POA: Diagnosis not present

## 2015-07-13 NOTE — Progress Notes (Signed)
Pre visit review using our clinic review tool, if applicable. No additional management support is needed unless otherwise documented below in the visit note. 

## 2015-07-13 NOTE — Patient Instructions (Signed)
Please increase the losartan-hctz to 2 tablets daily (100-25 mg daily).  Continue diet and exercise.  Follow-up with the nurse in 2-3 weeks for a BP recheck and with the lab the same day for a repeat metabolic panel.

## 2015-07-13 NOTE — Assessment & Plan Note (Signed)
Will increase BP medication to 100-25 mg of the losartan-HCTZ daily. Continue DASH diet. Follow-up in 2 weeks -- Nurse Visit for BP check and Lab for BMP.

## 2015-07-13 NOTE — Progress Notes (Signed)
Patient presents to clinic today for 2 week follow-up of hypertension after medication switch to losartan-hydrochlorothiazide 50-12.5 mg daily. Patient endorses cough has resolved with cessation of cough. Patient denies chest pain, palpitations, lightheadedness, dizziness, vision changes or frequent headaches.  BP Readings from Last 3 Encounters:  07/13/15 153/93  06/18/15 150/94  03/16/15 154/94   Past Medical History  Diagnosis Date  . HTN (hypertension)   . Gout   . Hx of diabetes mellitus     Resolved  . Chicken pox   . Skin lesion     Benign    Current Outpatient Prescriptions on File Prior to Visit  Medication Sig Dispense Refill  . aspirin 81 MG tablet Take 81 mg by mouth daily.    . Chromium Picolinate 500 MCG CAPS Take 1 capsule by mouth daily.    Marland Kitchen losartan-hydrochlorothiazide (HYZAAR) 50-12.5 MG tablet Take 1 tablet by mouth daily. 90 tablet 1  . metFORMIN (GLUCOPHAGE) 1000 MG tablet Take 1 tablet (1,000 mg total) by mouth 2 (two) times daily with a meal. 60 tablet 5  . fluticasone (FLONASE) 50 MCG/ACT nasal spray Place 2 sprays into both nostrils daily. (Patient not taking: Reported on 07/13/2015) 16 g 6   No current facility-administered medications on file prior to visit.    Allergies  Allergen Reactions  . Penicillins Anaphylaxis    Family History  Problem Relation Age of Onset  . Heart disease Father 34    Deceased  . Heart attack Father   . Hypertension Father   . Diabetes Father   . Arthritis-Osteo Mother     Living  . Colon cancer Mother   . Stroke Mother   . Hypertension Mother   . Breast cancer Mother   . Asthma Mother   . Heart disease Paternal Grandfather   . Heart attack Paternal Grandfather   . Heart attack Maternal Grandmother   . Healthy Sister   . Healthy Brother   . Asthma Son     x2  . Asthma Maternal Grandfather     Social History   Social History  . Marital Status: Divorced    Spouse Name: N/A  . Number of Children: N/A   . Years of Education: N/A   Social History Main Topics  . Smoking status: Former Smoker    Quit date: 05/14/2014  . Smokeless tobacco: Never Used  . Alcohol Use: 4.2 oz/week    7 Standard drinks or equivalent per week  . Drug Use: No  . Sexual Activity: Yes   Other Topics Concern  . None   Social History Narrative   Review of Systems - See HPI.  All other ROS are negative.  BP 153/93 mmHg  Pulse 84  Temp(Src) 98.2 F (36.8 C) (Oral)  Ht 6\' 1"  (1.854 m)  Wt 300 lb (136.079 kg)  BMI 39.59 kg/m2  SpO2 97%  Physical Exam  Constitutional: He is oriented to person, place, and time and well-developed, well-nourished, and in no distress.  HENT:  Head: Normocephalic and atraumatic.  Eyes: Conjunctivae are normal.  Neck: Neck supple.  Cardiovascular: Normal rate, regular rhythm, normal heart sounds and intact distal pulses.   Pulmonary/Chest: Effort normal and breath sounds normal. No respiratory distress. He has no wheezes. He has no rales. He exhibits no tenderness.  Neurological: He is alert and oriented to person, place, and time.  Skin: Skin is warm and dry. No rash noted.  Psychiatric: Affect normal.  Vitals reviewed.  Assessment/Plan: Essential hypertension, benign  Will increase BP medication to 100-25 mg of the losartan-HCTZ daily. Continue DASH diet. Follow-up in 2 weeks -- Nurse Visit for BP check and Lab for BMP.

## 2015-08-17 ENCOUNTER — Ambulatory Visit (INDEPENDENT_AMBULATORY_CARE_PROVIDER_SITE_OTHER): Payer: BLUE CROSS/BLUE SHIELD | Admitting: Family Medicine

## 2015-08-17 VITALS — BP 141/79 | HR 71

## 2015-08-17 DIAGNOSIS — I10 Essential (primary) hypertension: Secondary | ICD-10-CM

## 2015-08-17 NOTE — Progress Notes (Signed)
RN blood check note reviewed. Agree with documention and plan. 

## 2015-08-17 NOTE — Progress Notes (Signed)
Pre visit review using our clinic review tool, if applicable. No additional management support is needed unless otherwise documented below in the visit note.  Patient presents in office for a blood pressure check per OV note 07/13/15. Verified medications with the patient. Today's readings were as follow: BP 132/59 P 84 (R arm) & B/P 141/79 P 71 (L arm).  Per Dr. Charlett Blake: Continue current medication regimen and manage salt & caffeine intake. Get proper rest and follow up in 2-3 months for an office visit with PCP. Patient made aware of the provider's instructions. He understood and did not voice any concerns. Patient stated that he, "will call the office to schedule next appointment".

## 2015-08-17 NOTE — Patient Instructions (Signed)
Per Dr. Charlett Blake: Continue current medication regimen and manage salt & caffeine intake. Get proper rest and follow up in 2-3 months for an office visit with PCP.

## 2015-08-19 ENCOUNTER — Telehealth: Payer: Self-pay | Admitting: Physician Assistant

## 2015-08-19 MED ORDER — LOSARTAN POTASSIUM-HCTZ 50-12.5 MG PO TABS: 1.0000 | ORAL_TABLET | Freq: Every day | ORAL | Status: DC

## 2015-08-19 NOTE — Telephone Encounter (Signed)
Pt is requesting a call back. He says he need a refill on his losartan-hydrochlorothiazide. Pt says that he is currently out.   Pt would like to also be put back on his Gout medication.    CB: [phone]  Pharmacy: [id number] - [place], [place]

## 2015-08-19 NOTE — Telephone Encounter (Signed)
BP medication refilled.  Advise on patients request of gout medication not on current list, but is on historical list.

## 2015-08-20 MED ORDER — LOSARTAN POTASSIUM-HCTZ 100-25 MG PO TABS: 1.0000 | ORAL_TABLET | Freq: Every day | ORAL | Status: DC

## 2015-08-20 NOTE — Telephone Encounter (Signed)
He has old script for allopurinol which we have never filled. This is a medication to prevent gout. Is he having a flare-up of gout -- if so he needs to be seen and a different medication would be a better option. If no acute symptoms, I would need a uric acid level check before restarting allopurinol. Also we would want to consider changing BP medication as the hydrochlorothiazide in the medication can perpetuate gout attacks.

## 2015-08-20 NOTE — Telephone Encounter (Addendum)
Called and spoke with the pt and informed him of the note below.  Pt verbalized understanding.   Pt stated that he is having a flare-up.  Informed the pt that he will need an office visit.  Pt stated that he can't come in today,but he will call back on next week.  Asked the pt did he want to see how he does this weekend and he stated yes.  He said if not any better after the weekend he will call back to schedule an appt.   Pt also stated that the pharmacy will not refill the Losartan/HCTZ that was sent in on yest.  He stated that at his last [place] increased his Losartan/HCTZ to 100/25, so he was taking 2 of the 50/12.5 daily and he has ran out sooner.  So the insurance will not cover for him to get it because it is to soon.  He stated that the pharmacy stated that he will need a new prescription for the 100/25mg .  New prescription was sent to the pharmacy by e-script, and the pt is aware.//AB/CMA

## 2015-08-23 ENCOUNTER — Ambulatory Visit (INDEPENDENT_AMBULATORY_CARE_PROVIDER_SITE_OTHER): Payer: BLUE CROSS/BLUE SHIELD | Admitting: Physician Assistant

## 2015-08-23 ENCOUNTER — Ambulatory Visit: Payer: BLUE CROSS/BLUE SHIELD | Admitting: Physician Assistant

## 2015-08-23 ENCOUNTER — Encounter: Payer: Self-pay | Admitting: Physician Assistant

## 2015-08-23 VITALS — BP 130/90 | HR 88 | Temp 98.3°F | Ht 73.0 in | Wt 298.2 lb

## 2015-08-23 DIAGNOSIS — M10072 Idiopathic gout, left ankle and foot: Secondary | ICD-10-CM

## 2015-08-23 DIAGNOSIS — M109 Gout, unspecified: Secondary | ICD-10-CM

## 2015-08-23 LAB — URIC ACID: Uric Acid, Serum: 6.9 mg/dL (ref 4.0–7.8)

## 2015-08-23 MED ORDER — COLCHICINE 0.6 MG PO TABS: 0.6000 mg | ORAL_TABLET | Freq: Every day | ORAL | Status: DC

## 2015-08-23 NOTE — Patient Instructions (Signed)
Please go to the lab for blood work. I will call with results.  Take the Colchicine taking 2 tablets today. Then once tablet daily. Avoid trigger foods. Again we have discussed that your BP medication is likely a contributor but you are wanting to stay on medication since other medicines have not worked well for you.  Follow-up in 1 week. Once symptoms are completely resolved we will restart allopurinol.

## 2015-08-23 NOTE — Assessment & Plan Note (Signed)
Resolving. Will add on colchicine to take 1.2 mg today. Then 0.6 mg daily for prevention until allopurinol can be restarted. Will check uric acid level today. He is to avoid trigger foods. Discussed that thiazide diuretic is a likely culprit. Patient does not want to change medication as this is the only combination that has helped with blood pressure. Encouraged patient to reconsider so that he will not need preventative gout medication. Follow-up 1 week.

## 2015-08-23 NOTE — Progress Notes (Signed)
Pre visit review using our clinic review tool, if applicable. No additional management support is needed unless otherwise documented below in the visit note. 

## 2015-08-23 NOTE — Progress Notes (Signed)
Patient presents to clinic today c/o gout flares starting 2 weeks ago. Has had 2 flares since that time, lasting only about 3 days each. Endorses pain, redness and swelling of left 1st MTP joint. Tried to stay off of the foot as much as possible. Endorses mild redness, swelling today. Has not had flare in several years. Denies dietary changes recently but has had red meat and alcohol recently.   Past Medical History  Diagnosis Date  . HTN (hypertension)   . Gout   . Hx of diabetes mellitus     Resolved  . Chicken pox   . Skin lesion     Benign    Current Outpatient Prescriptions on File Prior to Visit  Medication Sig Dispense Refill  . aspirin 81 MG tablet Take 81 mg by mouth daily.    . Chromium Picolinate 500 MCG CAPS Take 1 capsule by mouth daily.    Marland Kitchen losartan-hydrochlorothiazide (HYZAAR) 100-25 MG tablet Take 1 tablet by mouth daily. 90 tablet 1  . metFORMIN (GLUCOPHAGE) 1000 MG tablet Take 1 tablet (1,000 mg total) by mouth 2 (two) times daily with a meal. 60 tablet 5   No current facility-administered medications on file prior to visit.    Allergies  Allergen Reactions  . Penicillins Anaphylaxis    Family History  Problem Relation Age of Onset  . Heart disease Father 38    Deceased  . Heart attack Father   . Hypertension Father   . Diabetes Father   . Arthritis-Osteo Mother     Living  . Colon cancer Mother   . Stroke Mother   . Hypertension Mother   . Breast cancer Mother   . Asthma Mother   . Heart disease Paternal Grandfather   . Heart attack Paternal Grandfather   . Heart attack Maternal Grandmother   . Healthy Sister   . Healthy Brother   . Asthma Son     x2  . Asthma Maternal Grandfather     Social History   Social History  . Marital Status: Divorced    Spouse Name: N/A  . Number of Children: N/A  . Years of Education: N/A   Social History Main Topics  . Smoking status: Former Smoker    Quit date: 05/14/2014  . Smokeless tobacco: Never  Used  . Alcohol Use: 4.2 oz/week    7 Standard drinks or equivalent per week  . Drug Use: No  . Sexual Activity: Yes   Other Topics Concern  . None   Social History Narrative   Review of Systems - See HPI.  All other ROS are negative.  BP 130/90 mmHg  Pulse 88  Temp(Src) 98.3 F (36.8 C) (Oral)  Ht 6\' 1"  (1.854 m)  Wt 298 lb 3.2 oz (135.263 kg)  BMI 39.35 kg/m2  SpO2 98%  Physical Exam  Constitutional: He is oriented to person, place, and time and well-developed, well-nourished, and in no distress.  HENT:  Head: Normocephalic and atraumatic.  Eyes: Conjunctivae are normal.  Neck: Neck supple.  Cardiovascular: Normal rate, regular rhythm, normal heart sounds and intact distal pulses.   Pulmonary/Chest: Effort normal and breath sounds normal. No respiratory distress. He has no wheezes. He has no rales. He exhibits no tenderness.  Musculoskeletal:       Feet:  Neurological: He is alert and oriented to person, place, and time.  Skin: Skin is warm and dry. No rash noted.  Psychiatric: Affect normal.  Vitals reviewed.    Assessment/Plan:  Acute gout Resolving. Will add on colchicine to take 1.2 mg today. Then 0.6 mg daily for prevention until allopurinol can be restarted. Will check uric acid level today. He is to avoid trigger foods. Discussed that thiazide diuretic is a likely culprit. Patient does not want to change medication as this is the only combination that has helped with blood pressure. Encouraged patient to reconsider so that he will not need preventative gout medication. Follow-up 1 week.

## 2015-08-24 LAB — HM DIABETES EYE EXAM

## 2015-08-30 ENCOUNTER — Ambulatory Visit (INDEPENDENT_AMBULATORY_CARE_PROVIDER_SITE_OTHER): Payer: BLUE CROSS/BLUE SHIELD | Admitting: Physician Assistant

## 2015-08-30 ENCOUNTER — Encounter: Payer: Self-pay | Admitting: Physician Assistant

## 2015-08-30 VITALS — BP 120/70 | HR 89 | Temp 98.3°F | Ht 73.0 in | Wt 296.2 lb

## 2015-08-30 DIAGNOSIS — M1A9XX0 Chronic gout, unspecified, without tophus (tophi): Secondary | ICD-10-CM | POA: Diagnosis not present

## 2015-08-30 MED ORDER — ALLOPURINOL 100 MG PO TABS: 100.0000 mg | ORAL_TABLET | Freq: Every day | ORAL | Status: DC

## 2015-08-30 NOTE — Assessment & Plan Note (Signed)
Will restart allopurinol. Will complete colchicine course over the next few days before stopping. Follow-up 4-6 weeks.

## 2015-08-30 NOTE — Progress Notes (Signed)
Patient presents to clinic today for follow-up of gout attack and to restart allopurinol. Endorses complete resolution of symptoms. Has been on colchicine daily to help bridge to restarting allopurinol. Denies new or worsening symptoms. Uric acid level at last visit was normal but have previously discussed that uric acid can be falsely normal during acute attack.  Past Medical History  Diagnosis Date  . HTN (hypertension)   . Gout   . Hx of diabetes mellitus     Resolved  . Chicken pox   . Skin lesion     Benign    Current Outpatient Prescriptions on File Prior to Visit  Medication Sig Dispense Refill  . aspirin 81 MG tablet Take 81 mg by mouth daily.    . Chromium Picolinate 500 MCG CAPS Take 1 capsule by mouth daily.    . colchicine 0.6 MG tablet Take 1 tablet (0.6 mg total) by mouth daily. 30 tablet 1  . losartan-hydrochlorothiazide (HYZAAR) 100-25 MG tablet Take 1 tablet by mouth daily. 90 tablet 1  . metFORMIN (GLUCOPHAGE) 1000 MG tablet Take 1 tablet (1,000 mg total) by mouth 2 (two) times daily with a meal. (Patient taking differently: Take 1,000 mg by mouth daily. ) 60 tablet 5   No current facility-administered medications on file prior to visit.    Allergies  Allergen Reactions  . Penicillins Anaphylaxis    Family History  Problem Relation Age of Onset  . Heart disease Father 43    Deceased  . Heart attack Father   . Hypertension Father   . Diabetes Father   . Arthritis-Osteo Mother     Living  . Colon cancer Mother   . Stroke Mother   . Hypertension Mother   . Breast cancer Mother   . Asthma Mother   . Heart disease Paternal Grandfather   . Heart attack Paternal Grandfather   . Heart attack Maternal Grandmother   . Healthy Sister   . Healthy Brother   . Asthma Son     x2  . Asthma Maternal Grandfather     Social History   Social History  . Marital Status: Divorced    Spouse Name: N/A  . Number of Children: N/A  . Years of Education: N/A    Social History Main Topics  . Smoking status: Former Smoker    Quit date: 05/14/2014  . Smokeless tobacco: Never Used  . Alcohol Use: 4.2 oz/week    7 Standard drinks or equivalent per week  . Drug Use: No  . Sexual Activity: Yes   Other Topics Concern  . None   Social History Narrative    Review of Systems - See HPI.  All other ROS are negative.  BP 120/70 mmHg  Pulse 89  Temp(Src) 98.3 F (36.8 C) (Oral)  Ht 6\' 1"  (1.854 m)  Wt 296 lb 3.2 oz (134.355 kg)  BMI 39.09 kg/m2  SpO2 97%  Physical Exam  Constitutional: He is oriented to person, place, and time and well-developed, well-nourished, and in no distress.  HENT:  Head: Normocephalic and atraumatic.  Cardiovascular: Normal rate, regular rhythm, normal heart sounds and intact distal pulses.   Pulmonary/Chest: Effort normal and breath sounds normal. No respiratory distress. He has no wheezes. He has no rales. He exhibits no tenderness.  Musculoskeletal:       Right foot: Normal.       Left foot: Normal.  Neurological: He is alert and oriented to person, place, and time.  Skin: Skin  is warm and dry. No rash noted.  Vitals reviewed.   Recent Results (from the past 2160 hour(s))  Uric acid     Status: None   Collection Time: 08/23/15 11:01 AM  Result Value Ref Range   Uric Acid, Serum 6.9 4.0 - 7.8 mg/dL    Assessment/Plan: Gout, chronic Will restart allopurinol. Will complete colchicine course over the next few days before stopping. Follow-up 4-6 weeks.

## 2015-08-30 NOTE — Progress Notes (Signed)
Pre visit review using our clinic review tool, if applicable. No additional management support is needed unless otherwise documented below in the visit note. 

## 2015-08-30 NOTE — Patient Instructions (Signed)
I am glad you are doing better. Please start the allopurinol daily. Continue colchicine for a couple of days before stopping.  Follow up with me in 4-6 weeks.

## 2015-09-08 ENCOUNTER — Ambulatory Visit: Payer: BLUE CROSS/BLUE SHIELD | Admitting: Physician Assistant

## 2015-09-10 ENCOUNTER — Ambulatory Visit: Payer: BLUE CROSS/BLUE SHIELD | Admitting: Physician Assistant

## 2015-09-20 ENCOUNTER — Encounter: Payer: Self-pay | Admitting: *Deleted

## 2015-10-04 ENCOUNTER — Ambulatory Visit (INDEPENDENT_AMBULATORY_CARE_PROVIDER_SITE_OTHER): Payer: BLUE CROSS/BLUE SHIELD | Admitting: Physician Assistant

## 2015-10-04 ENCOUNTER — Encounter: Payer: Self-pay | Admitting: Physician Assistant

## 2015-10-04 VITALS — BP 128/86 | HR 85 | Temp 98.1°F | Resp 16 | Ht 73.0 in | Wt 298.5 lb

## 2015-10-04 DIAGNOSIS — H6692 Otitis media, unspecified, left ear: Secondary | ICD-10-CM

## 2015-10-04 DIAGNOSIS — IMO0001 Reserved for inherently not codable concepts without codable children: Secondary | ICD-10-CM

## 2015-10-04 DIAGNOSIS — M1A9XX0 Chronic gout, unspecified, without tophus (tophi): Secondary | ICD-10-CM

## 2015-10-04 DIAGNOSIS — E1165 Type 2 diabetes mellitus with hyperglycemia: Secondary | ICD-10-CM | POA: Diagnosis not present

## 2015-10-04 MED ORDER — AZITHROMYCIN 250 MG PO TABS: ORAL_TABLET | ORAL | Status: DC

## 2015-10-04 NOTE — Progress Notes (Signed)
Pre visit review using our clinic review tool, if applicable. No additional management support is needed unless otherwise documented below in the visit note/SLS  

## 2015-10-04 NOTE — Patient Instructions (Signed)
Please go to the lab for blood work. I will call you with your results.  Please continue chronic medications as directed.  Start the antibiotic as directed for the ear infection. Increase fluids.  Tylenol if needed for pain. Follow-up if symptoms are not resolving.

## 2015-10-04 NOTE — Progress Notes (Signed)
Patient presents to clinic today for follow-up of gout after starting allopurinol 100 mg daily. Endorses taking daily as directed. Denies side effect of medication. Denies flare-up of gout. Is due for a repeat uric acid level.  Endorses 3 days of  left ear pain, maxillary sinus pressure/pain. Endorses left ear pressure with decreased hearing.  Denies trauma or injury. Denies fevers, chills, cough or chest congestion.  Past Medical History  Diagnosis Date  . HTN (hypertension)   . Gout   . Hx of diabetes mellitus     Resolved  . Chicken pox   . Skin lesion     Benign    Current Outpatient Prescriptions on File Prior to Visit  Medication Sig Dispense Refill  . allopurinol (ZYLOPRIM) 100 MG tablet Take 1 tablet (100 mg total) by mouth daily. 30 tablet 6  . aspirin 81 MG tablet Take 81 mg by mouth daily.    . Chromium Picolinate 500 MCG CAPS Take 1 capsule by mouth daily.    . colchicine 0.6 MG tablet Take 1 tablet (0.6 mg total) by mouth daily. 30 tablet 1  . losartan-hydrochlorothiazide (HYZAAR) 100-25 MG tablet Take 1 tablet by mouth daily. 90 tablet 1  . metFORMIN (GLUCOPHAGE) 1000 MG tablet Take 1 tablet (1,000 mg total) by mouth 2 (two) times daily with a meal. (Patient taking differently: Take 1,000 mg by mouth daily with breakfast. ) 60 tablet 5   No current facility-administered medications on file prior to visit.    Allergies  Allergen Reactions  . Penicillins Anaphylaxis    Family History  Problem Relation Age of Onset  . Heart disease Father 69    Deceased  . Heart attack Father   . Hypertension Father   . Diabetes Father   . Arthritis-Osteo Mother     Living  . Colon cancer Mother   . Stroke Mother   . Hypertension Mother   . Breast cancer Mother   . Asthma Mother   . Heart disease Paternal Grandfather   . Heart attack Paternal Grandfather   . Heart attack Maternal Grandmother   . Healthy Sister   . Healthy Brother   . Asthma Son     x2  . Asthma  Maternal Grandfather     Social History   Social History  . Marital Status: Divorced    Spouse Name: N/A  . Number of Children: N/A  . Years of Education: N/A   Social History Main Topics  . Smoking status: Former Smoker    Quit date: 05/14/2014  . Smokeless tobacco: Never Used  . Alcohol Use: 4.2 oz/week    7 Standard drinks or equivalent per week  . Drug Use: No  . Sexual Activity: Yes   Other Topics Concern  . None   Social History Narrative   Review of Systems - See HPI.  All other ROS are negative.  Ht _0  (1.854 m)  Wt 298 lb 8 oz (135.399 kg)  BMI 39.39 kg/m2  Physical Exam  Constitutional: He is oriented to person, place, and time and well-developed, well-nourished, and in no distress.  HENT:  Head: Normocephalic and atraumatic.  Right Ear: Tympanic membrane and external ear normal. Tympanic membrane is not erythematous and not bulging.  Left Ear: Tympanic membrane is erythematous and bulging.  Nose: Nose normal.  Mouth/Throat: Oropharynx is clear and moist. No oropharyngeal exudate.  Eyes: Conjunctivae are normal.  Cardiovascular: Normal rate, regular rhythm, normal heart sounds and intact distal pulses.  Pulmonary/Chest: Effort normal and breath sounds normal. No respiratory distress. He has no wheezes. He has no rales. He exhibits no tenderness.  Neurological: He is alert and oriented to person, place, and time.  Skin: Skin is warm and dry. No rash noted.  Psychiatric: Affect normal.  Vitals reviewed.   Recent Results (from the past 2160 hour(s))  Uric acid     Status: None   Collection Time: 08/23/15 11:01 AM  Result Value Ref Range   Uric Acid, Serum 6.9 4.0 - 7.8 mg/dL  HM DIABETES EYE EXAM     Status: None   Collection Time: 08/24/15 12:00 AM  Result Value Ref Range   HM Diabetic Eye Exam No Retinopathy No Retinopathy    Assessment/Plan: 1. Acute left otitis media, recurrence not specified, unspecified otitis media  type Penicillin-allergic. Rx Azithromycin. Supportive measures reviewed. FU PRN. - azithromycin (ZITHROMAX) 250 MG tablet; Take 2 tablets on Day 1. Then take 1 tablet daily.  Dispense: 6 tablet; Refill: 0  2. Chronic gout without tophus, unspecified cause, unspecified site Doing well on Allopurinol. Will repeat uric acid level to determine need for dose increase. - Uric acid  3. Uncontrolled type 2 diabetes mellitus without complication, without long-term current use of insulin ([place]) Taking medication as directed. Has been working on diet and exercise. Will repeat labs today. - Hemoglobin A1c - Comp Met (CMET)  Leeanne Rio, PA-C

## 2015-10-05 LAB — URIC ACID: Uric Acid, Serum: 7.8 mg/dL (ref 4.0–7.8)

## 2015-10-05 LAB — COMPREHENSIVE METABOLIC PANEL
ALBUMIN: 4 g/dL (ref 3.5–5.2)
ALK PHOS: 52 U/L (ref 39–117)
ALT: 37 U/L (ref 0–53)
AST: 29 U/L (ref 0–37)
BUN: 13 mg/dL (ref 6–23)
CHLORIDE: 106 meq/L (ref 96–112)
CO2: 25 mEq/L (ref 19–32)
Calcium: 8.8 mg/dL (ref 8.4–10.5)
Creatinine, Ser: 0.99 mg/dL (ref 0.40–1.50)
GFR: 84.4 mL/min (ref >60.00)
Glucose, Bld: 183 mg/dL — ABNORMAL HIGH (ref 70–99)
POTASSIUM: 3.8 meq/L (ref 3.5–5.1)
SODIUM: 139 meq/L (ref 135–145)
TOTAL PROTEIN: 7 g/dL (ref 6.0–8.3)
Total Bilirubin: 0.8 mg/dL (ref 0.2–1.2)

## 2015-10-05 LAB — HEMOGLOBIN A1C: Hgb A1c MFr Bld: 7.8 % — ABNORMAL HIGH (ref 4.6–6.5)

## 2015-10-20 ENCOUNTER — Telehealth: Payer: Self-pay | Admitting: Physician Assistant

## 2015-10-20 NOTE — Telephone Encounter (Signed)
Can be reached: [phone] Pharmacy: [id number] - [place], [place]  Reason for call: Pt still has ear infection. He said it was a little better for a couple days following the course of abx but it is getting worse again. Pt requesting another abx be sent in for him.

## 2015-10-21 NOTE — Telephone Encounter (Signed)
Can send in Rx Clarithromycin 250 mg BID x 7 days.

## 2015-10-22 MED ORDER — CLARITHROMYCIN 250 MG PO TABS: 250.0000 mg | ORAL_TABLET | Freq: Two times a day (BID) | ORAL | Status: DC

## 2015-10-22 NOTE — Telephone Encounter (Signed)
Attempt to reach pt via phone, line rings but then no answer nor ans machine message; Rx to pharmacy/[facility]

## 2015-11-05 ENCOUNTER — Encounter: Payer: Self-pay | Admitting: Physician Assistant

## 2015-11-05 ENCOUNTER — Ambulatory Visit (INDEPENDENT_AMBULATORY_CARE_PROVIDER_SITE_OTHER): Payer: BLUE CROSS/BLUE SHIELD | Admitting: Physician Assistant

## 2015-11-05 VITALS — BP 112/73 | HR 83 | Temp 98.2°F | Resp 16 | Ht 73.0 in | Wt 297.1 lb

## 2015-11-05 DIAGNOSIS — H6092 Unspecified otitis externa, left ear: Secondary | ICD-10-CM

## 2015-11-05 MED ORDER — CIPROFLOXACIN-DEXAMETHASONE 0.3-0.1 % OT SUSP: OTIC | Status: DC

## 2015-11-05 NOTE — Progress Notes (Signed)
Pre visit review using our clinic review tool, if applicable. No additional management support is needed unless otherwise documented below in the visit note/SLS  

## 2015-11-05 NOTE — Patient Instructions (Signed)
Please apply the ear drops as directed. Avoid use of Qtips or other objects used to clean the ear. If symptoms are not resolving, we will need to get you in with ENT.  Otitis Externa Otitis externa is a germ infection in the outer ear. The outer ear is the area from the eardrum to the outside of the ear. Otitis externa is sometimes called "swimmer's ear." HOME CARE  Put drops in the ear as told by your doctor.  Only take medicine as told by your doctor.  If you have diabetes, your doctor may give you more directions. Follow your doctor's directions.  Keep all doctor visits as told. To avoid another infection:  Keep your ear dry. Use the corner of a towel to dry your ear after swimming or bathing.  Avoid scratching or putting things inside your ear.  Avoid swimming in lakes, dirty water, or pools that use a chemical called chlorine poorly.  You may use ear drops after swimming. Combine equal amounts of white vinegar and alcohol in a bottle. Put 3 or 4 drops in each ear. GET HELP IF:   You have a fever.  Your ear is still red, puffy (swollen), or painful after 3 days.  You still have yellowish-white fluid (pus) coming from the ear after 3 days.  Your redness, puffiness, or pain gets worse.  You have a really bad headache.  You have redness, puffiness, pain, or tenderness behind your ear. MAKE SURE YOU:   Understand these instructions.  Will watch your condition.  Will get help right away if you are not doing well or get worse.   This information is not intended to replace advice given to you by your health care provider. Make sure you discuss any questions you have with your health care provider.   Document Released: 10/25/2007 Document Revised: 05/29/2014 Document Reviewed: 05/25/2011 Elsevier Interactive Patient Education [employer].

## 2015-11-05 NOTE — Progress Notes (Signed)
 Patient presents to clinic today c/o recurrence of left ear pain over the past week. Endorses pressure, pain and sensation of fluid in the ear. Denies drainage from ear. Denies change in hearing. Denies symptoms of R ear. Denies cough, congestion, sinus pressure/pain or other URI symptoms. Endorses some discomfort with eating on the affected side. Denies tooth pain.  Past Medical History  Diagnosis Date  . HTN (hypertension)   . Gout   . Hx of diabetes mellitus     Resolved  . Chicken pox   . Skin lesion     Benign    Current Outpatient Prescriptions on File Prior to Visit  Medication Sig Dispense Refill  . allopurinol (ZYLOPRIM) 100 MG tablet Take 1 tablet (100 mg total) by mouth daily. 30 tablet 6  . aspirin 81 MG tablet Take 81 mg by mouth daily.    . Chromium Picolinate 500 MCG CAPS Take 2 capsules by mouth daily.     . losartan-hydrochlorothiazide (HYZAAR) 100-25 MG tablet Take 1 tablet by mouth daily. 90 tablet 1  . metFORMIN (GLUCOPHAGE) 1000 MG tablet Take 1 tablet (1,000 mg total) by mouth 2 (two) times daily with a meal. (Patient taking differently: Take 1,000 mg by mouth daily with breakfast. ) 60 tablet 5   No current facility-administered medications on file prior to visit.    Allergies  Allergen Reactions  . Penicillins Anaphylaxis    Family History  Problem Relation Age of Onset  . Heart disease Father 54    Deceased  . Heart attack Father   . Hypertension Father   . Diabetes Father   . Arthritis-Osteo Mother     Living  . Colon cancer Mother   . Stroke Mother   . Hypertension Mother   . Breast cancer Mother   . Asthma Mother   . Heart disease Paternal Grandfather   . Heart attack Paternal Grandfather   . Heart attack Maternal Grandmother   . Healthy Sister   . Healthy Brother   . Asthma Son     x2  . Asthma Maternal Grandfather     Social History   Social History  . Marital Status: Divorced    Spouse Name: N/A  . Number of Children: N/A    . Years of Education: N/A   Social History Main Topics  . Smoking status: Former Smoker    Quit date: 05/14/2014  . Smokeless tobacco: Never Used  . Alcohol Use: 4.2 oz/week    7 Standard drinks or equivalent per week  . Drug Use: No  . Sexual Activity: Yes   Other Topics Concern  . None   Social History Narrative   Review of Systems - See HPI.  All other ROS are negative.  BP 112/73 mmHg  Pulse 83  Temp(Src) 98.2 F (36.8 C) (Oral)  Resp 16  Ht 6' 1" (1.854 m)  Wt 297 lb 2 oz (134.775 kg)  BMI 39.21 kg/m2  SpO2 99%  Physical Exam  Constitutional: He is oriented to person, place, and time and well-developed, well-nourished, and in no distress.  HENT:  Head: Normocephalic and atraumatic.  Right Ear: Tympanic membrane and ear canal normal.  Left Ear: Tympanic membrane normal. There is swelling.  No middle ear effusion.  Nose: Nose normal.  Mouth/Throat: Uvula is midline and oropharynx is clear and moist.  L ear canal with swelling, erythema. No laceration or bleeding noted. No foreign body noted  Eyes: Conjunctivae are normal.  Cardiovascular: Normal rate, regular   rhythm, normal heart sounds and intact distal pulses.   Pulmonary/Chest: Effort normal and breath sounds normal. No respiratory distress. He has no wheezes. He has no rales. He exhibits no tenderness.  Neurological: He is alert and oriented to person, place, and time.  Skin: Skin is warm. No rash noted.  Psychiatric: Affect normal.  Vitals reviewed.   Recent Results (from the past 2160 hour(s))  Uric acid     Status: None   Collection Time: 08/23/15 11:01 AM  Result Value Ref Range   Uric Acid, Serum 6.9 4.0 - 7.8 mg/dL  HM DIABETES EYE EXAM     Status: None   Collection Time: 08/24/15 12:00 AM  Result Value Ref Range   HM Diabetic Eye Exam No Retinopathy No Retinopathy  Hemoglobin A1c     Status: Abnormal   Collection Time: 10/04/15  4:18 PM  Result Value Ref Range   Hgb A1c MFr Bld 7.8 (H) 4.6 -  6.5 %    Comment: Glycemic Control Guidelines for People with Diabetes:Non Diabetic:  <6%Goal of Therapy: <7%Additional Action Suggested:  >8%   Uric acid     Status: None   Collection Time: 10/04/15  4:18 PM  Result Value Ref Range   Uric Acid, Serum 7.8 4.0 - 7.8 mg/dL  Comp Met (CMET)     Status: Abnormal   Collection Time: 10/04/15  4:18 PM  Result Value Ref Range   Sodium 139 135 - 145 mEq/L   Potassium 3.8 3.5 - 5.1 mEq/L   Chloride 106 96 - 112 mEq/L   CO2 25 19 - 32 mEq/L   Glucose, Bld 183 (H) 70 - 99 mg/dL   BUN 13 6 - 23 mg/dL   Creatinine, Ser 0.99 0.40 - 1.50 mg/dL   Total Bilirubin 0.8 0.2 - 1.2 mg/dL   Alkaline Phosphatase 52 39 - 117 U/L   AST 29 0 - 37 U/L   ALT 37 0 - 53 U/L   Total Protein 7.0 6.0 - 8.3 g/dL   Albumin 4.0 3.5 - 5.2 g/dL   Calcium 8.8 8.4 - 10.5 mg/dL   GFR 84.40 >60.00 mL/min    Assessment/Plan: 1. Left otitis externa Rx Ciprodex. Supportive measures reviewed. ENT if symptoms are not resolving.  - ciprofloxacin-dexamethasone (CIPRODEX) otic suspension; Apply 4 drops to the affected ear(s) twice daily x 7 days  Dispense: 7.5 mL; Refill: 0     Leeanne Rio, PA-C

## 2016-01-12 ENCOUNTER — Ambulatory Visit: Payer: BLUE CROSS/BLUE SHIELD | Admitting: Physician Assistant

## 2016-02-05 ENCOUNTER — Other Ambulatory Visit: Payer: Self-pay | Admitting: Physician Assistant

## 2016-03-21 ENCOUNTER — Telehealth: Payer: Self-pay | Admitting: *Deleted

## 2016-03-21 MED ORDER — ALLOPURINOL 100 MG PO TABS: 100.0000 mg | ORAL_TABLET | Freq: Every day | ORAL | 1 refills | Status: DC

## 2016-03-21 NOTE — Telephone Encounter (Signed)
Faxed refill request received from [facility] for Allopurinol [90-day supply] Last filled by MD on 08/30/15, #30x6 Last AEX - 11/01/15 Rx request to pharmacy/[facility]

## 2016-03-24 ENCOUNTER — Other Ambulatory Visit: Payer: Self-pay | Admitting: Physician Assistant

## 2016-03-24 MED ORDER — ALLOPURINOL 100 MG PO TABS: 100.0000 mg | ORAL_TABLET | Freq: Every day | ORAL | 1 refills | Status: DC

## 2016-06-28 ENCOUNTER — Other Ambulatory Visit: Payer: Self-pay | Admitting: Physician Assistant

## 2016-07-11 ENCOUNTER — Ambulatory Visit: Payer: 59 | Admitting: Physician Assistant

## 2016-07-11 DIAGNOSIS — Z0289 Encounter for other administrative examinations: Secondary | ICD-10-CM

## 2016-07-18 ENCOUNTER — Ambulatory Visit (INDEPENDENT_AMBULATORY_CARE_PROVIDER_SITE_OTHER): Payer: 59 | Admitting: Physician Assistant

## 2016-07-18 ENCOUNTER — Encounter: Payer: Self-pay | Admitting: Physician Assistant

## 2016-07-18 VITALS — BP 138/82 | HR 78 | Temp 98.1°F | Resp 14 | Ht 73.0 in | Wt 295.0 lb

## 2016-07-18 DIAGNOSIS — Z0000 Encounter for general adult medical examination without abnormal findings: Secondary | ICD-10-CM | POA: Diagnosis not present

## 2016-07-18 DIAGNOSIS — Z125 Encounter for screening for malignant neoplasm of prostate: Secondary | ICD-10-CM

## 2016-07-18 DIAGNOSIS — E1140 Type 2 diabetes mellitus with diabetic neuropathy, unspecified: Secondary | ICD-10-CM

## 2016-07-18 DIAGNOSIS — IMO0002 Reserved for concepts with insufficient information to code with codable children: Secondary | ICD-10-CM

## 2016-07-18 DIAGNOSIS — I10 Essential (primary) hypertension: Secondary | ICD-10-CM

## 2016-07-18 DIAGNOSIS — Z8249 Family history of ischemic heart disease and other diseases of the circulatory system: Secondary | ICD-10-CM

## 2016-07-18 DIAGNOSIS — M1A9XX0 Chronic gout, unspecified, without tophus (tophi): Secondary | ICD-10-CM

## 2016-07-18 DIAGNOSIS — E1165 Type 2 diabetes mellitus with hyperglycemia: Secondary | ICD-10-CM

## 2016-07-18 DIAGNOSIS — Z1211 Encounter for screening for malignant neoplasm of colon: Secondary | ICD-10-CM

## 2016-07-18 LAB — COMPREHENSIVE METABOLIC PANEL
ALBUMIN: 4.3 g/dL (ref 3.5–5.2)
ALT: 52 U/L (ref 0–53)
AST: 38 U/L — AB (ref 0–37)
Alkaline Phosphatase: 67 U/L (ref 39–117)
BUN: 20 mg/dL (ref 6–23)
CALCIUM: 9.7 mg/dL (ref 8.4–10.5)
CHLORIDE: 103 meq/L (ref 96–112)
CO2: 27 mEq/L (ref 19–32)
Creatinine, Ser: 0.98 mg/dL (ref 0.40–1.50)
GFR: 85.13 mL/min (ref >60.00)
Glucose, Bld: 136 mg/dL — ABNORMAL HIGH (ref 70–99)
POTASSIUM: 4 meq/L (ref 3.5–5.1)
Sodium: 138 mEq/L (ref 135–145)
Total Bilirubin: 0.7 mg/dL (ref 0.2–1.2)
Total Protein: 7.1 g/dL (ref 6.0–8.3)

## 2016-07-18 LAB — CBC
HEMATOCRIT: 48 % (ref 39.0–52.0)
Hemoglobin: 16.4 g/dL (ref 13.0–17.0)
MCHC: 34.1 g/dL (ref 30.0–36.0)
MCV: 94.7 fl (ref 78.0–100.0)
PLATELETS: 208 10*3/uL (ref 150.0–400.0)
RBC: 5.07 Mil/uL (ref 4.22–5.81)
RDW: 13.5 % (ref 11.5–15.5)
WBC: 9.6 10*3/uL (ref 4.0–10.5)

## 2016-07-18 LAB — URINALYSIS, ROUTINE W REFLEX MICROSCOPIC
BILIRUBIN URINE: NEGATIVE
KETONES UR: NEGATIVE
Leukocytes, UA: NEGATIVE
Nitrite: NEGATIVE
PH: 6 (ref 5.0–8.0)
SPECIFIC GRAVITY, URINE: 1.02 (ref 1.000–1.030)
TOTAL PROTEIN, URINE-UPE24: NEGATIVE
UROBILINOGEN UA: 0.2 (ref 0.0–1.0)
Urine Glucose: NEGATIVE
WBC, UA: NONE SEEN (ref 0–?)

## 2016-07-18 LAB — LDL CHOLESTEROL, DIRECT: Direct LDL: 120 mg/dL

## 2016-07-18 LAB — TSH: TSH: 2.1 u[IU]/mL (ref 0.35–4.50)

## 2016-07-18 LAB — LIPID PANEL
CHOL/HDL RATIO: 5
CHOLESTEROL: 194 mg/dL (ref 0–200)
HDL: 36.8 mg/dL — AB (ref >39.00)
NONHDL: 156.7
TRIGLYCERIDES: 275 mg/dL — AB (ref 0.0–149.0)
VLDL: 55 mg/dL — AB (ref 0.0–40.0)

## 2016-07-18 LAB — PSA: PSA: 0.17 ng/mL (ref 0.10–4.00)

## 2016-07-18 LAB — HEMOGLOBIN A1C: HEMOGLOBIN A1C: 7.7 % — AB (ref 4.6–6.5)

## 2016-07-18 NOTE — Progress Notes (Signed)
Pre visit review using our clinic review tool, if applicable. No additional management support is needed unless otherwise documented below in the visit note. 

## 2016-07-18 NOTE — Patient Instructions (Addendum)
Please go to the lab for blood work.   Our office will call you with your results unless you have chosen to receive results via MyChart.  If your blood work is normal we will follow-up each year for physicals and as scheduled for chronic medical problems.  If anything is abnormal we will treat accordingly and get you in for a follow-up.  You    Preventive Care 40-64 Years, Male Preventive care refers to lifestyle choices and visits with your health care provider that can promote health and wellness. What does preventive care include?  A yearly physical exam. This is also called an annual well check.  Dental exams once or twice a year.  Routine eye exams. Ask your health care provider how often you should have your eyes checked.  Personal lifestyle choices, including:  Daily care of your teeth and gums.  Regular physical activity.  Eating a healthy diet.  Avoiding tobacco and drug use.  Limiting alcohol use.  Practicing safe sex.  Taking low-dose aspirin every day starting at age 69. What happens during an annual well check? The services and screenings done by your health care provider during your annual well check will depend on your age, overall health, lifestyle risk factors, and family history of disease. Counseling  Your health care provider may ask you questions about your:  Alcohol use.  Tobacco use.  Drug use.  Emotional well-being.  Home and relationship well-being.  Sexual activity.  Eating habits.  Work and work Statistician. Screening  You may have the following tests or measurements:  Height, weight, and BMI.  Blood pressure.  Lipid and cholesterol levels. These may be checked every 5 years, or more frequently if you are over 56 years old.  Skin check.  Lung cancer screening. You may have this screening every year starting at age 64 if you have a 30-pack-year history of smoking and currently smoke or have quit within the past 15  years.  Fecal occult blood test (FOBT) of the stool. You may have this test every year starting at age 17.  Flexible sigmoidoscopy or colonoscopy. You may have a sigmoidoscopy every 5 years or a colonoscopy every 10 years starting at age 6.  Prostate cancer screening. Recommendations will vary depending on your family history and other risks.  Hepatitis C blood test.  Hepatitis B blood test.  Sexually transmitted disease (STD) testing.  Diabetes screening. This is done by checking your blood sugar (glucose) after you have not eaten for a while (fasting). You may have this done every 1-3 years. Discuss your test results, treatment options, and if necessary, the need for more tests with your health care provider. Vaccines  Your health care provider may recommend certain vaccines, such as:  Influenza vaccine. This is recommended every year.  Tetanus, diphtheria, and acellular pertussis (Tdap, Td) vaccine. You may need a Td booster every 10 years.  Varicella vaccine. You may need this if you have not been vaccinated.  Zoster vaccine. You may need this after age 44.  Measles, mumps, and rubella (MMR) vaccine. You may need at least one dose of MMR if you were born in 1957 or later. You may also need a second dose.  Pneumococcal 13-valent conjugate (PCV13) vaccine. You may need this if you have certain conditions and have not been vaccinated.  Pneumococcal polysaccharide (PPSV23) vaccine. You may need one or two doses if you smoke cigarettes or if you have certain conditions.  Meningococcal vaccine. You may need this  if you have certain conditions.  Hepatitis A vaccine. You may need this if you have certain conditions or if you travel or work in places where you may be exposed to hepatitis A.  Hepatitis B vaccine. You may need this if you have certain conditions or if you travel or work in places where you may be exposed to hepatitis B.  Haemophilus influenzae type b (Hib) vaccine.  You may need this if you have certain risk factors. Talk to your health care provider about which screenings and vaccines you need and how often you need them. This information is not intended to replace advice given to you by your health care provider. Make sure you discuss any questions you have with your health care provider. Document Released: 06/04/2015 Document Revised: 01/26/2016 Document Reviewed: 03/09/2015 Elsevier Interactive Patient Education  2017 [employer]. .

## 2016-07-18 NOTE — Progress Notes (Signed)
Patient presents to clinic today for annual exam.  Patient is fasting for labs.  Chronic Issues: Diabetes Mellitus II -- previously uncontrolled (Goal < 7.5). Last A1C 5/17 at 7.8. Is currently on Metformin 1000 mg but only taking once daily. Endorses home cooked meals at night. Lunch is typically fast food -- double cheeseburgers, fries. Breakfast is not consistent but is trying to eat oatmeal. Does note increased weight gain due to diet.  Fasting sugars checked daily. Averaging 120-130. No exercise at present other than walking at work. Has seen nutrition before and states he knows what he needs to do. Keeps an eye on feet. Denies ulcers, sores, numbness or tingling. Is on ARB.  Hypertension -- Is currently on losartan-hctz. .Is taking as directed. Patient denies chest pain, palpitations, lightheadedness, dizziness, vision changes or frequent headaches. Is takin 81 mg daily.  BP Readings from Last 3 Encounters:  07/18/16 138/82  11/05/15 112/73  10/04/15 128/86   Gout -- Is on Allopurinol 100 mg daily. Is taking as directed.   Health Maintenance: Immunizations -- declines flu shot. Tetanus and Pneumonia is up-to-date. Colonoscopy -- Has never had before. Mother with metastatic colon cancer in 34s. Will need referral to gastroenterology for screening colonoscopy.   Past Medical History:  Diagnosis Date  . Chicken pox   . Gout   . HTN (hypertension)   . Hx of diabetes mellitus    Resolved  . Skin lesion    Benign    Past Surgical History:  Procedure Laterality Date  . ARTERY EXPLORATION     Right Forearm  . TONSILLECTOMY AND ADENOIDECTOMY  1973    Current Outpatient Prescriptions on File Prior to Visit  Medication Sig Dispense Refill  . allopurinol (ZYLOPRIM) 100 MG tablet Take 1 tablet (100 mg total) by mouth daily. 90 tablet 1  . aspirin 81 MG tablet Take 81 mg by mouth daily.    . Chromium Picolinate 500 MCG CAPS Take 2 capsules by mouth daily.     Marland Kitchen  losartan-hydrochlorothiazide (HYZAAR) 100-25 MG tablet TAKE 1 TABLET BY MOUTH DAILY. 90 tablet 1   No current facility-administered medications on file prior to visit.     Allergies  Allergen Reactions  . Penicillins Anaphylaxis    Family History  Problem Relation Age of Onset  . Heart disease Father 34    Deceased  . Heart attack Father   . Hypertension Father   . Diabetes Father   . Arthritis-Osteo Mother     Living  . Colon cancer Mother   . Stroke Mother   . Hypertension Mother   . Breast cancer Mother   . Asthma Mother   . Heart disease Paternal Grandfather   . Heart attack Paternal Grandfather   . Heart attack Maternal Grandmother   . Healthy Sister   . Healthy Brother   . Asthma Son     x2  . Asthma Maternal Grandfather     Social History   Social History  . Marital status: Divorced    Spouse name: N/A  . Number of children: N/A  . Years of education: N/A   Occupational History  . Not on file.   Social History Main Topics  . Smoking status: Former Smoker    Quit date: 05/14/2014  . Smokeless tobacco: Never Used  . Alcohol use 4.2 oz/week    7 Standard drinks or equivalent per week     Comment: occasional  . Drug use: No  . Sexual activity:  Yes   Other Topics Concern  . Not on file   Social History Narrative  . No narrative on file   Review of Systems  Constitutional: Negative for fever and weight loss.  HENT: Negative for ear discharge, ear pain, hearing loss and tinnitus.   Eyes: Negative for blurred vision, double vision, photophobia and pain.  Respiratory: Negative for cough and shortness of breath.   Cardiovascular: Negative for chest pain and palpitations.  Gastrointestinal: Negative for abdominal pain, blood in stool, constipation, diarrhea, heartburn, melena, nausea and vomiting.  Genitourinary: Negative for dysuria, flank pain, frequency, hematuria and urgency.  Musculoskeletal: Negative for falls.  Neurological: Negative for  dizziness, loss of consciousness and headaches.  Endo/Heme/Allergies: Negative for environmental allergies.  Psychiatric/Behavioral: Negative for depression, hallucinations, substance abuse and suicidal ideas. The patient is not nervous/anxious and does not have insomnia.    BP 138/82   Pulse 78   Temp 98.1 F (36.7 C) (Oral)   Resp 14   Ht 6\' 1"  (1.854 m)   Wt 295 lb (133.8 kg)   SpO2 96%   BMI 38.92 kg/m   Physical Exam  Constitutional: He is oriented to person, place, and time and well-developed, well-nourished, and in no distress.  HENT:  Head: Normocephalic and atraumatic.  Right Ear: External ear normal.  Left Ear: External ear normal.  Nose: Nose normal.  Mouth/Throat: Oropharynx is clear and moist. No oropharyngeal exudate.  Eyes: Conjunctivae and EOM are normal. Pupils are equal, round, and reactive to light.  Neck: Neck supple. No thyromegaly present.  Cardiovascular: Normal rate, regular rhythm, normal heart sounds and intact distal pulses.   Pulmonary/Chest: Effort normal and breath sounds normal. No respiratory distress. He has no wheezes. He has no rales. He exhibits no tenderness.  Abdominal: Soft. Bowel sounds are normal. He exhibits no distension and no mass. There is no tenderness. There is no rebound and no guarding.  Genitourinary: Testes/scrotum normal.  Genitourinary Comments: Declines dre.  Lymphadenopathy:    He has no cervical adenopathy.  Neurological: He is alert and oriented to person, place, and time.  Skin: Skin is warm and dry. No rash noted.  Psychiatric: Affect normal.  Vitals reviewed.  Assessment/Plan: Essential hypertension, benign BP stable. Asymptomatic. Labs today. Continue current regimen. Giving family history, order for EST placed.   Diabetes mellitus type II, uncontrolled Taking medications but not as directed. Endorses good fasting sugars.  Eye examination and immunizations are up-to-date.  Patient is on ARB. Foot exam updated  today.  Repeat labs today. Will alter medications accordingly.   Gout, chronic Uric acid level today. Continue Allopurinol.   Colon cancer screening Referral to GI for screening colonoscopy placed.   Prostate cancer screening The natural history of prostate cancer and ongoing controversy regarding screening and potential treatment outcomes of prostate cancer has been discussed with the patient. The meaning of a false positive PSA and a false negative PSA has been discussed. He indicates understanding of the limitations of this screening test and wishes to proceed with screening PSA testing.   Visit for preventive health examination Depression screen negative. Health Maintenance reviewed. Preventive schedule discussed and handout given in AVS. Will obtain fasting labs today.     Leeanne Rio, PA-C

## 2016-07-19 ENCOUNTER — Other Ambulatory Visit (INDEPENDENT_AMBULATORY_CARE_PROVIDER_SITE_OTHER): Payer: 59

## 2016-07-19 DIAGNOSIS — Z8249 Family history of ischemic heart disease and other diseases of the circulatory system: Secondary | ICD-10-CM | POA: Insufficient documentation

## 2016-07-19 DIAGNOSIS — I10 Essential (primary) hypertension: Secondary | ICD-10-CM | POA: Diagnosis not present

## 2016-07-19 DIAGNOSIS — Z1211 Encounter for screening for malignant neoplasm of colon: Secondary | ICD-10-CM | POA: Insufficient documentation

## 2016-07-19 DIAGNOSIS — Z125 Encounter for screening for malignant neoplasm of prostate: Secondary | ICD-10-CM | POA: Insufficient documentation

## 2016-07-19 LAB — URIC ACID: Uric Acid, Serum: 7 mg/dL (ref 4.0–7.8)

## 2016-07-20 ENCOUNTER — Other Ambulatory Visit: Payer: Self-pay | Admitting: Emergency Medicine

## 2016-07-20 MED ORDER — METFORMIN HCL 1000 MG PO TABS: 1000.0000 mg | ORAL_TABLET | Freq: Two times a day (BID) | ORAL | 3 refills | Status: DC

## 2016-07-25 NOTE — Assessment & Plan Note (Signed)
Taking medications but not as directed. Endorses good fasting sugars.  Eye examination and immunizations are up-to-date.  Patient is on ARB. Foot exam updated today.  Repeat labs today. Will alter medications accordingly.

## 2016-07-25 NOTE — Assessment & Plan Note (Signed)
Uric acid level today °Continue Allopurinol °

## 2016-07-25 NOTE — Assessment & Plan Note (Signed)
The natural history of prostate cancer and ongoing controversy regarding screening and potential treatment outcomes of prostate cancer has been discussed with the patient. The meaning of a false positive PSA and a false negative PSA has been discussed. He indicates understanding of the limitations of this screening test and wishes  to proceed with screening PSA testing.  

## 2016-07-25 NOTE — Assessment & Plan Note (Addendum)
BP stable. Asymptomatic. Labs today. Continue current regimen. Giving family history, order for EST placed.

## 2016-07-25 NOTE — Assessment & Plan Note (Signed)
Referral to GI for screening colonoscopy placed.

## 2016-07-25 NOTE — Assessment & Plan Note (Signed)
Depression screen negative. Health Maintenance reviewed. Preventive schedule discussed and handout given in AVS. Will obtain fasting labs today.  

## 2016-08-04 ENCOUNTER — Encounter (HOSPITAL_COMMUNITY): Payer: 59

## 2016-08-15 ENCOUNTER — Encounter: Payer: Self-pay | Admitting: Physician Assistant

## 2016-08-21 ENCOUNTER — Other Ambulatory Visit: Payer: Self-pay | Admitting: Physician Assistant

## 2016-09-02 ENCOUNTER — Other Ambulatory Visit: Payer: Self-pay | Admitting: Physician Assistant

## 2016-09-06 ENCOUNTER — Other Ambulatory Visit: Payer: Self-pay | Admitting: Physician Assistant

## 2016-09-20 ENCOUNTER — Other Ambulatory Visit: Payer: Self-pay | Admitting: Physician Assistant

## 2016-11-17 ENCOUNTER — Ambulatory Visit: Payer: 59 | Admitting: Physician Assistant

## 2016-11-18 LAB — HM DIABETES EYE EXAM

## 2016-11-21 ENCOUNTER — Ambulatory Visit (INDEPENDENT_AMBULATORY_CARE_PROVIDER_SITE_OTHER): Payer: 59 | Admitting: Physician Assistant

## 2016-11-21 ENCOUNTER — Encounter: Payer: Self-pay | Admitting: Physician Assistant

## 2016-11-21 VITALS — BP 134/80 | HR 83 | Temp 98.6°F | Resp 16 | Ht 73.0 in | Wt 299.0 lb

## 2016-11-21 DIAGNOSIS — M722 Plantar fascial fibromatosis: Secondary | ICD-10-CM | POA: Diagnosis not present

## 2016-11-21 DIAGNOSIS — E1165 Type 2 diabetes mellitus with hyperglycemia: Secondary | ICD-10-CM | POA: Diagnosis not present

## 2016-11-21 DIAGNOSIS — IMO0002 Reserved for concepts with insufficient information to code with codable children: Secondary | ICD-10-CM

## 2016-11-21 DIAGNOSIS — E1140 Type 2 diabetes mellitus with diabetic neuropathy, unspecified: Secondary | ICD-10-CM | POA: Diagnosis not present

## 2016-11-21 LAB — COMPREHENSIVE METABOLIC PANEL
ALT: 57 U/L — AB (ref 9–46)
AST: 42 U/L — AB (ref 10–35)
Albumin: 4.4 g/dL (ref 3.6–5.1)
Alkaline Phosphatase: 69 U/L (ref 40–115)
BILIRUBIN TOTAL: 0.9 mg/dL (ref 0.2–1.2)
BUN: 18 mg/dL (ref 7–25)
CALCIUM: 9.5 mg/dL (ref 8.6–10.3)
CO2: 23 mmol/L (ref 20–31)
Chloride: 103 mmol/L (ref 98–110)
Creat: 1.1 mg/dL (ref 0.70–1.33)
Glucose, Bld: 178 mg/dL — ABNORMAL HIGH (ref 65–99)
Potassium: 4.4 mmol/L (ref 3.5–5.3)
Sodium: 138 mmol/L (ref 135–146)
Total Protein: 7.4 g/dL (ref 6.1–8.1)

## 2016-11-21 MED ORDER — MELOXICAM 15 MG PO TABS: 15.0000 mg | ORAL_TABLET | Freq: Every day | ORAL | 0 refills | Status: DC

## 2016-11-21 NOTE — Patient Instructions (Addendum)
Please be consistent when taking your Metformin. You need to take twice daily every day. Continue recent proactive changes with diet. We are checking labs today. We will alter regimen based on lab results.  For the foot, wear the ACE wrap as directed. Do the cold can exercises as directed. Wear supportive footwear with good arch support. Take Mobic once daily as directed with food.  Start exercises below. If not improving we will need to get an x-ray to assess for bone spurring and set you up with Podiatry.   Plantar Fasciitis Rehab Ask your health care provider which exercises are safe for you. Do exercises exactly as told by your health care provider and adjust them as directed. It is normal to feel mild stretching, pulling, tightness, or discomfort as you do these exercises, but you should stop right away if you feel sudden pain or your pain gets worse. Do not begin these exercises until told by your health care provider. Stretching and range of motion exercises These exercises warm up your muscles and joints and improve the movement and flexibility of your foot. These exercises also help to relieve pain. Exercise A: Plantar fascia stretch  1. Sit with your left / right leg crossed over your opposite knee. 2. Hold your heel with one hand with that thumb near your arch. With your other hand, hold your toes and gently pull them back toward the top of your foot. You should feel a stretch on the bottom of your toes or your foot or both. 3. Hold this stretch for__________ seconds. 4. Slowly release your toes and return to the starting position. Repeat __________ times. Complete this exercise __________ times a day. Exercise B: Gastroc, standing  1. Stand with your hands against a wall. 2. Extend your left / right leg behind you, and bend your front knee slightly. 3. Keeping your heels on the floor and keeping your back knee straight, shift your weight toward the wall without arching your back. You  should feel a gentle stretch in your left / right calf. 4. Hold this position for __________ seconds. Repeat __________ times. Complete this exercise __________ times a day. Exercise C: Soleus, standing 1. Stand with your hands against a wall. 2. Extend your left / right leg behind you, and bend your front knee slightly. 3. Keeping your heels on the floor, bend your back knee and slightly shift your weight over the back leg. You should feel a gentle stretch deep in your calf. 4. Hold this position for __________ seconds. Repeat __________ times. Complete this exercise __________ times a day. Exercise D: Gastrocsoleus, standing 1. Stand with the ball of your left / right foot on a step. The ball of your foot is on the walking surface, right under your toes. 2. Keep your other foot firmly on the same step. 3. Hold onto the wall or a railing for balance. 4. Slowly lift your other foot, allowing your body weight to press your heel down over the edge of the step. You should feel a stretch in your left / right calf. 5. Hold this position for __________ seconds. 6. Return both feet to the step. 7. Repeat this exercise with a slight bend in your left / right knee. Repeat __________ times with your left / right knee straight and __________ times with your left / right knee bent. Complete this exercise __________ times a day. Balance exercise This exercise builds your balance and strength control of your arch to help take pressure  off your plantar fascia. Exercise E: Single leg stand 1. Without shoes, stand near a railing or in a doorway. You may hold onto the railing or door frame as needed. 2. Stand on your left / right foot. Keep your big toe down on the floor and try to keep your arch lifted. Do not let your foot roll inward. 3. Hold this position for __________ seconds. 4. If this exercise is too easy, you can try it with your eyes closed or while standing on a pillow. Repeat __________ times.  Complete this exercise __________ times a day. This information is not intended to replace advice given to you by your health care provider. Make sure you discuss any questions you have with your health care provider. Document Released: 05/08/2005 Document Revised: 01/11/2016 Document Reviewed: 03/22/2015 Elsevier Interactive Patient Education  2018 [employer].

## 2016-11-21 NOTE — Progress Notes (Signed)
Patient presents to clinic today c/o L heel pain x 2.5 week. Pain is on the bottom of the foot and is sharp, especially with first steps in the morning after prolonged resting. Denies redness, swelling, numbness, bruising. Denies trauma or injury. Denies pain elsewhere in the foot. Has not taken anything for symptoms. Always wears closed-toes shoes. States current pair is a few years old. Has noted working much longer shifts.   Patient also noting increased in sugars over the past few months. Patient notes 2 months he quit smoking. Since then has noted increased appetite. Has only recently started cutting back on intake. Previously eating fast food daily. Has only been taking Metformin 1000 mg once daily instead of BID as directed. Notes sugars averaging in the 250s up until restarting Metformin BID has been coming down daily over the past week with this morning's glucose at 150.  Past Medical History:  Diagnosis Date  . Chicken pox   . Gout   . HTN (hypertension)   . Hx of diabetes mellitus    Resolved  . Skin lesion    Benign    Current Outpatient Prescriptions on File Prior to Visit  Medication Sig Dispense Refill  . allopurinol (ZYLOPRIM) 100 MG tablet TAKE 1 TABLET BY MOUTH EVERY DAY 90 tablet 1  . aspirin 81 MG tablet Take 81 mg by mouth daily.    . Chromium Picolinate 500 MCG CAPS Take 2 capsules by mouth daily.     Marland Kitchen losartan-hydrochlorothiazide (HYZAAR) 100-25 MG tablet TAKE 1 TABLET BY MOUTH DAILY 90 tablet 0  . metFORMIN (GLUCOPHAGE) 1000 MG tablet TAKE 1 TABLET BY MOUTH TWICE A DAY WITH MEALS 60 tablet 3   No current facility-administered medications on file prior to visit.     Allergies  Allergen Reactions  . Penicillins Anaphylaxis    Family History  Problem Relation Age of Onset  . Heart disease Father 53       Deceased  . Heart attack Father   . Hypertension Father   . Diabetes Father   . Arthritis-Osteo Mother        Living  . Colon cancer Mother   .  Stroke Mother   . Hypertension Mother   . Breast cancer Mother   . Asthma Mother   . Heart disease Paternal Grandfather   . Heart attack Paternal Grandfather   . Heart attack Maternal Grandmother   . Healthy Sister   . Healthy Brother   . Asthma Son        x2  . Asthma Maternal Grandfather     Social History   Social History  . Marital status: Divorced    Spouse name: N/A  . Number of children: N/A  . Years of education: N/A   Social History Main Topics  . Smoking status: Former Smoker    Quit date: 05/14/2014  . Smokeless tobacco: Never Used  . Alcohol use 4.2 oz/week    7 Standard drinks or equivalent per week     Comment: occasional  . Drug use: No  . Sexual activity: Yes   Other Topics Concern  . None   Social History Narrative  . None   Review of Systems - See HPI.  All other ROS are negative.  BP 134/80   Pulse 83   Temp 98.6 F (37 C) (Oral)   Resp 16   Ht 6\' 1"  (1.854 m)   Wt 299 lb (135.6 kg)   SpO2 97%   BMI  39.45 kg/m   Physical Exam  Constitutional: He is oriented to person, place, and time and well-developed, well-nourished, and in no distress.  HENT:  Head: Normocephalic and atraumatic.  Eyes: Conjunctivae are normal.  Neck: Neck supple.  Cardiovascular: Normal rate, regular rhythm, normal heart sounds and intact distal pulses.   Pulmonary/Chest: Effort normal and breath sounds normal. No respiratory distress. He has no wheezes. He has no rales. He exhibits no tenderness.  Musculoskeletal:       Left foot: There is tenderness. There is no bony tenderness.       Feet:  Neurological: He is alert and oriented to person, place, and time.  Skin: Skin is warm and dry. No rash noted.  Psychiatric: Affect normal.  Vitals reviewed.   Diabetic Foot Exam - Simple   Simple Foot Form Diabetic Foot exam was performed with the following findings:  Yes 11/21/2016  4:25 PM  Visual Inspection No deformities, no ulcerations, no other skin breakdown  bilaterally:  Yes Sensation Testing Intact to touch and monofilament testing bilaterally:  Yes Pulse Check Posterior Tibialis and Dorsalis pulse intact bilaterally:  Yes Comments    Recent Results (from the past 2160 hour(s))  HM DIABETES EYE EXAM     Status: None   Collection Time: 11/18/16 12:00 AM  Result Value Ref Range   HM Diabetic Eye Exam No Retinopathy No Retinopathy    Assessment/Plan: Diabetes mellitus type II, uncontrolled Will have patient continue Metformin 1000 mg BID as directed, that he has just restarted. Dietary and exercise regimen reviewed. Repeat labs today. Will alter regimen accordingly.   Plantar fasciitis of left foot Supportive measures discussed. ACE warp applied. Cold can exercises to start. Stretches reviewed. Daily mobic to be started along with supportive foot wear. If not improving will obtain x-ray to assess for calcaneal spurring and refer to Podiatry.    Leeanne Rio, PA-C

## 2016-11-21 NOTE — Assessment & Plan Note (Signed)
Will have patient continue Metformin 1000 mg BID as directed, that he has just restarted. Dietary and exercise regimen reviewed. Repeat labs today. Will alter regimen accordingly.

## 2016-11-21 NOTE — Progress Notes (Signed)
Pre visit review using our clinic review tool, if applicable. No additional management support is needed unless otherwise documented below in the visit note. 

## 2016-11-21 NOTE — Assessment & Plan Note (Signed)
Supportive measures discussed. ACE warp applied. Cold can exercises to start. Stretches reviewed. Daily mobic to be started along with supportive foot wear. If not improving will obtain x-ray to assess for calcaneal spurring and refer to Podiatry.

## 2016-11-22 LAB — HEMOGLOBIN A1C
Hgb A1c MFr Bld: 8.5 % — ABNORMAL HIGH (ref <5.7)
Mean Plasma Glucose: 197 mg/dL

## 2016-11-23 ENCOUNTER — Encounter: Payer: Self-pay | Admitting: Emergency Medicine

## 2016-12-05 ENCOUNTER — Ambulatory Visit (INDEPENDENT_AMBULATORY_CARE_PROVIDER_SITE_OTHER): Payer: 59

## 2016-12-05 ENCOUNTER — Other Ambulatory Visit: Payer: Self-pay | Admitting: Physician Assistant

## 2016-12-05 ENCOUNTER — Ambulatory Visit (INDEPENDENT_AMBULATORY_CARE_PROVIDER_SITE_OTHER): Payer: 59 | Admitting: Physician Assistant

## 2016-12-05 ENCOUNTER — Encounter: Payer: Self-pay | Admitting: Physician Assistant

## 2016-12-05 VITALS — BP 128/84 | HR 59 | Temp 97.7°F | Resp 14 | Ht 73.0 in | Wt 302.0 lb

## 2016-12-05 DIAGNOSIS — E1165 Type 2 diabetes mellitus with hyperglycemia: Secondary | ICD-10-CM | POA: Diagnosis not present

## 2016-12-05 DIAGNOSIS — E1140 Type 2 diabetes mellitus with diabetic neuropathy, unspecified: Secondary | ICD-10-CM | POA: Diagnosis not present

## 2016-12-05 DIAGNOSIS — M722 Plantar fascial fibromatosis: Secondary | ICD-10-CM | POA: Diagnosis not present

## 2016-12-05 DIAGNOSIS — IMO0002 Reserved for concepts with insufficient information to code with codable children: Secondary | ICD-10-CM

## 2016-12-05 LAB — BASIC METABOLIC PANEL
BUN: 21 mg/dL (ref 6–23)
CO2: 27 meq/L (ref 19–32)
CREATININE: 1 mg/dL (ref 0.40–1.50)
Calcium: 9.4 mg/dL (ref 8.4–10.5)
Chloride: 104 mEq/L (ref 96–112)
GFR: 83.05 mL/min (ref >60.00)
Glucose, Bld: 145 mg/dL — ABNORMAL HIGH (ref 70–99)
POTASSIUM: 4.2 meq/L (ref 3.5–5.1)
Sodium: 139 mEq/L (ref 135–145)

## 2016-12-05 MED ORDER — MELOXICAM 15 MG PO TABS: 15.0000 mg | ORAL_TABLET | Freq: Every day | ORAL | 0 refills | Status: DC

## 2016-12-05 NOTE — Progress Notes (Signed)
Patient presents to clinic today for follow-up of plantar fasciitis of L foot and for DM II after medication change.   Patient endorses taking Mobic as directed and doing stretching exercises recommended for plantar fasciitis. Noted significant improvement with this regimen for several days. States pain was much improved. Notes going hiking this past week and since that time symptoms have exacerbated and are not alleviated with Mobic and stretching. Denies new symptoms. Has not taken anything for symptoms today.   Patient was instructed to take his Metformin 1000 mg BID at last visit as he had only been taking QD. A1C had climbed to > 8. Patient states that since last visit he has worked hard on diet. Exercise is limited at present due to plantar fasciitis. Is taking Metformin BID as directed. Notes fasting sugars now averaging in the 130s.   Past Medical History:  Diagnosis Date  . Chicken pox   . Gout   . HTN (hypertension)   . Hx of diabetes mellitus    Resolved  . Skin lesion    Benign    Current Outpatient Prescriptions on File Prior to Visit  Medication Sig Dispense Refill  . allopurinol (ZYLOPRIM) 100 MG tablet TAKE 1 TABLET BY MOUTH EVERY DAY 90 tablet 1  . aspirin 81 MG tablet Take 81 mg by mouth daily.    . Chromium Picolinate 500 MCG CAPS Take 2 capsules by mouth daily.     Marland Kitchen losartan-hydrochlorothiazide (HYZAAR) 100-25 MG tablet TAKE 1 TABLET BY MOUTH DAILY 90 tablet 0  . meloxicam (MOBIC) 15 MG tablet Take 1 tablet (15 mg total) by mouth daily. 15 tablet 0  . metFORMIN (GLUCOPHAGE) 1000 MG tablet TAKE 1 TABLET BY MOUTH TWICE A DAY WITH MEALS 60 tablet 3   No current facility-administered medications on file prior to visit.     Allergies  Allergen Reactions  . Penicillins Anaphylaxis    Family History  Problem Relation Age of Onset  . Heart disease Father 64       Deceased  . Heart attack Father   . Hypertension Father   . Diabetes Father   . Arthritis-Osteo  Mother        Living  . Colon cancer Mother   . Stroke Mother   . Hypertension Mother   . Breast cancer Mother   . Asthma Mother   . Heart disease Paternal Grandfather   . Heart attack Paternal Grandfather   . Heart attack Maternal Grandmother   . Healthy Sister   . Healthy Brother   . Asthma Son        x2  . Asthma Maternal Grandfather     Social History   Social History  . Marital status: Divorced    Spouse name: N/A  . Number of children: N/A  . Years of education: N/A   Social History Main Topics  . Smoking status: Former Smoker    Quit date: 05/14/2014  . Smokeless tobacco: Never Used  . Alcohol use 4.2 oz/week    7 Standard drinks or equivalent per week     Comment: occasional  . Drug use: No  . Sexual activity: Yes   Other Topics Concern  . None   Social History Narrative  . None   Review of Systems - See HPI.  All other ROS are negative.  BP 128/84   Pulse (!) 59   Temp 97.7 F (36.5 C) (Oral)   Resp 14   Ht _0  (1.854 m)  Wt (!) 302 lb (137 kg)   SpO2 97%   BMI 39.84 kg/m   Physical Exam  Constitutional: He is oriented to person, place, and time and well-developed, well-nourished, and in no distress.  HENT:  Head: Normocephalic and atraumatic.  Eyes: Conjunctivae are normal.  Neck: Neck supple.  Cardiovascular: Normal rate, regular rhythm, normal heart sounds and intact distal pulses.   Pulmonary/Chest: Effort normal and breath sounds normal. No respiratory distress. He has no wheezes. He has no rales. He exhibits no tenderness.  Neurological: He is alert and oriented to person, place, and time.  Skin: Skin is warm and dry. No rash noted.  Psychiatric: Affect normal.  Vitals reviewed.  Recent Results (from the past 2160 hour(s))  HM DIABETES EYE EXAM     Status: None   Collection Time: 11/18/16 12:00 AM  Result Value Ref Range   HM Diabetic Eye Exam No Retinopathy No Retinopathy  Comp Met (CMET)     Status: Abnormal   Collection  Time: 11/21/16  4:08 PM  Result Value Ref Range   Sodium 138 135 - 146 mmol/L   Potassium 4.4 3.5 - 5.3 mmol/L   Chloride 103 98 - 110 mmol/L   CO2 23 20 - 31 mmol/L   Glucose, Bld 178 (H) 65 - 99 mg/dL   BUN 18 7 - 25 mg/dL   Creat 1.10 0.70 - 1.33 mg/dL    Comment:   For patients > or = 53 years of age: The upper reference limit for Creatinine is approximately 13% higher for people identified as African-American.      Total Bilirubin 0.9 0.2 - 1.2 mg/dL   Alkaline Phosphatase 69 40 - 115 U/L   AST 42 (H) 10 - 35 U/L   ALT 57 (H) 9 - 46 U/L   Total Protein 7.4 6.1 - 8.1 g/dL   Albumin 4.4 3.6 - 5.1 g/dL   Calcium 9.5 8.6 - 10.3 mg/dL  Hemoglobin A1c     Status: Abnormal   Collection Time: 11/21/16  4:08 PM  Result Value Ref Range   Hgb A1c MFr Bld 8.5 (H) <5.7 %    Comment:   For someone without known diabetes, a hemoglobin A1c value of 6.5% or greater indicates that they may have diabetes and this should be confirmed with a follow-up test.   For someone with known diabetes, a value <7% indicates that their diabetes is well controlled and a value greater than or equal to 7% indicates suboptimal control. A1c targets should be individualized based on duration of diabetes, age, comorbid conditions, and other considerations.   Currently, no consensus exists for use of hemoglobin A1c for diagnosis of diabetes for children.      Mean Plasma Glucose 197 mg/dL    Assessment/Plan: 1. Plantar fasciitis of left foot X-ray today to assess for bone spur. Continue antiinflammatory and stretching. Will alter regimen based on results. May need Podiatry assessment. - DG Foot Complete Left; Future  2. Uncontrolled type 2 diabetes mellitus with diabetic neuropathy, without long-term current use of insulin (HCC) Fasting sugars improving with compliance to BID Metformin. Continue same. Will repeat BMP today. Follow-up discussed.  - Basic metabolic panel   Leeanne Rio,  PA-C

## 2016-12-05 NOTE — Patient Instructions (Signed)
Please go to the lab for blood work. I will call you with your results.  Please speak with Levada Dy at the front desk for instructions to the [facility] office for your x-ray.  I will call with your results and alter your regimen accordingly.   Please continue Metformin as directed, being more mindful to take twice daily. We can consider switching to an extended release medication but this is usually more expensive. Keep up with diet and exercise. Follow-up 3 months.

## 2016-12-05 NOTE — Progress Notes (Signed)
Pre visit review using our clinic review tool, if applicable. No additional management support is needed unless otherwise documented below in the visit note. 

## 2016-12-17 ENCOUNTER — Other Ambulatory Visit: Payer: Self-pay | Admitting: Physician Assistant

## 2016-12-18 ENCOUNTER — Ambulatory Visit: Payer: 59

## 2016-12-18 ENCOUNTER — Encounter: Payer: Self-pay | Admitting: Podiatry

## 2016-12-18 ENCOUNTER — Ambulatory Visit (INDEPENDENT_AMBULATORY_CARE_PROVIDER_SITE_OTHER): Payer: 59 | Admitting: Podiatry

## 2016-12-18 VITALS — BP 164/100 | HR 70 | Resp 16 | Ht 73.0 in | Wt 300.0 lb

## 2016-12-18 DIAGNOSIS — M722 Plantar fascial fibromatosis: Secondary | ICD-10-CM | POA: Diagnosis not present

## 2016-12-18 MED ORDER — TRIAMCINOLONE ACETONIDE 10 MG/ML IJ SUSP: 10.0000 mg | Freq: Once | INTRAMUSCULAR | Status: DC

## 2016-12-18 NOTE — Progress Notes (Signed)
   Subjective:    Patient ID: Louis Davis, male    DOB: December 30, 1965, 51 y.o.   MRN: [id number]  HPI Chief Complaint  Patient presents with  . Foot Pain    Left foot; bottom of the heel; pt stated, "Was told by doctor had Plantar Fasciitis"; x1 month; pt Diabetic Type 2; Sugar=218 this am; A1C=8.5      Review of Systems  All other systems reviewed and are negative.      Objective:   Physical Exam        Assessment & Plan:

## 2016-12-18 NOTE — Patient Instructions (Signed)

## 2016-12-19 NOTE — Progress Notes (Signed)
Subjective:    Patient ID: Louis Davis, male   DOB: 51 y.o.   MRN: [id number]   HPI patient states that he's had a lot of pain in the bottom of his left heel that's been present for about a month and getting worse and he's a diabetic and is supposed to be active    Review of Systems  All other systems reviewed and are negative.       Objective:  Physical Exam  Cardiovascular: Intact distal pulses.   Musculoskeletal: Normal range of motion.  Neurological: He is alert.  Skin: Skin is warm.  Nursing note and vitals reviewed.  neurovascular status intact muscle strength was adequate range of motion within normal limits with patient found to have exquisite discomfort plantar aspect left heel at the insertional point of the tendon into the calcaneus with inflammation and fluid around the medial band. Patient's found have good digital perfusion well oriented 3 with diabetes under reasonable control     Assessment:    Acute plantar fasciitis of the left heel insertional point tendon calcaneus     Plan:   H&P condition reviewed and injected the left plantar fascia 3 mg Kenalog 5 mill grams Xylocaine and applied fascial brace gave instructions on physical therapy supportive shoes and reappoint to recheck  X-rays indicate there is spur formation with no indications of stress fracture arthritis

## 2016-12-29 ENCOUNTER — Other Ambulatory Visit: Payer: Self-pay | Admitting: Physician Assistant

## 2016-12-29 NOTE — Telephone Encounter (Signed)
Is this acute medication or chronic medication?

## 2017-01-01 ENCOUNTER — Encounter: Payer: Self-pay | Admitting: Podiatry

## 2017-01-01 ENCOUNTER — Ambulatory Visit (INDEPENDENT_AMBULATORY_CARE_PROVIDER_SITE_OTHER): Payer: 59 | Admitting: Podiatry

## 2017-01-01 DIAGNOSIS — M722 Plantar fascial fibromatosis: Secondary | ICD-10-CM

## 2017-01-01 MED ORDER — TRIAMCINOLONE ACETONIDE 10 MG/ML IJ SUSP
10.0000 mg | Freq: Once | INTRAMUSCULAR | Status: AC
  Administered 2017-01-01: 10 mg

## 2017-01-03 NOTE — Progress Notes (Signed)
Subjective:    Patient ID: Louis Davis, male   DOB: 51 y.o.   MRN: [id number]   HPI patient presents stating that the heel has improved but is still tender with pressure and making it difficult to walk distances    ROS      Objective:  Physical Exam neurovascular status intact with pain in the left heel that's improved but is mildly painful when pressed     Assessment:    Plantar fasciitis left improved but still painful     Plan:    Reinjected the plantar fascial left 3 mg Kenalog 5 mill grams Xylocaine and discussed the instability of the arch and went ahead and scheduled with [name] for a customized orthotic to lift the arch and take pressure off the heel

## 2017-01-09 ENCOUNTER — Other Ambulatory Visit: Payer: Self-pay | Admitting: Family Medicine

## 2017-01-16 ENCOUNTER — Ambulatory Visit (INDEPENDENT_AMBULATORY_CARE_PROVIDER_SITE_OTHER): Payer: 59 | Admitting: Orthotics

## 2017-01-16 DIAGNOSIS — M722 Plantar fascial fibromatosis: Secondary | ICD-10-CM

## 2017-01-16 NOTE — Progress Notes (Signed)
.  Patient came into today to be cast for [employer] Foot [employer]otics. Upon recommendation of Dr. Paulla Dolly  Patient presents with Plantar F (L)  Patient is obese #300.  Goals are RF stability, longitudinal arch control,  Plan vendor [employer] to fab Silver device (fiberglass due to wt), w/ 4* FF valgus post, deep heel, wide.

## 2017-01-24 ENCOUNTER — Other Ambulatory Visit: Payer: Self-pay | Admitting: Family Medicine

## 2017-01-31 ENCOUNTER — Ambulatory Visit (INDEPENDENT_AMBULATORY_CARE_PROVIDER_SITE_OTHER): Payer: 59 | Admitting: Physician Assistant

## 2017-01-31 ENCOUNTER — Encounter: Payer: Self-pay | Admitting: Physician Assistant

## 2017-01-31 VITALS — BP 128/80 | HR 78 | Temp 98.5°F | Resp 14 | Ht 73.0 in | Wt 298.0 lb

## 2017-01-31 DIAGNOSIS — R8290 Unspecified abnormal findings in urine: Secondary | ICD-10-CM

## 2017-01-31 DIAGNOSIS — R0789 Other chest pain: Secondary | ICD-10-CM

## 2017-01-31 LAB — POCT URINALYSIS DIPSTICK
Bilirubin, UA: NEGATIVE
GLUCOSE UA: NEGATIVE
Ketones, UA: NEGATIVE
Leukocytes, UA: NEGATIVE
NITRITE UA: NEGATIVE
PH UA: 5.5 (ref 5.0–8.0)
PROTEIN UA: NEGATIVE
RBC UA: NEGATIVE
UROBILINOGEN UA: 0.2 U/dL

## 2017-01-31 MED ORDER — OLMESARTAN MEDOXOMIL-HCTZ 40-25 MG PO TABS: 1.0000 | ORAL_TABLET | Freq: Every day | ORAL | 3 refills | Status: DC

## 2017-01-31 NOTE — Progress Notes (Signed)
Patient presents to clinic today for follow-up of hypertension. Patient is currently on a regimen of losartan-hctz and 81 mg ASA daily. Has previously been controlled on this regimen. Endorses increased stressors recently including flare of sciatica and plantar fasciitis over the past few months that have resolved now, and taking care of his sick mother. Is also in a custody battle over youngest child presently. Denies panic attack but notes significant stress. IS affecting sleep, only averaging 3-4 hours per night. BP has been averaging 150-180/100-110s. Denies palpitations, SOB, lightheadedness/dizziness. Did note L-sided chest pain last week that was a soreness in nature and lasting for several days. Denies radiation of pain. Denies exertion cause of pain. Was not alleviated with rest. Pain was affected somewhat by movement of pectoral muscles. Patient does had uncontrolled diabetes mellitus, currently under treatment, and hyperlipidemia for which he has declined treatment.  Denies any symptoms presently.  Past Medical History:  Diagnosis Date  . Chicken pox   . Gout   . HTN (hypertension)   . Hx of diabetes mellitus    Resolved  . Skin lesion    Benign    Current Outpatient Prescriptions on File Prior to Visit  Medication Sig Dispense Refill  . allopurinol (ZYLOPRIM) 100 MG tablet TAKE 1 TABLET BY MOUTH EVERY DAY 90 tablet 1  . aspirin 81 MG tablet Take 81 mg by mouth daily.    . metFORMIN (GLUCOPHAGE) 1000 MG tablet TAKE 1 TABLET BY MOUTH TWICE A DAY WITH MEALS 60 tablet 3  . Chromium Picolinate 500 MCG CAPS Take 2 capsules by mouth daily.      Current Facility-Administered Medications on File Prior to Visit  Medication Dose Route Frequency Provider Last Rate Last Dose  . triamcinolone acetonide (KENALOG) 10 MG/ML injection 10 mg  10 mg Other Once Wallene Huh, DPM        Allergies  Allergen Reactions  . Penicillins Anaphylaxis    Family History  Problem Relation Age of  Onset  . Heart disease Father 40       Deceased  . Heart attack Father   . Hypertension Father   . Diabetes Father   . Arthritis-Osteo Mother        Living  . Colon cancer Mother   . Stroke Mother   . Hypertension Mother   . Breast cancer Mother   . Asthma Mother   . Heart disease Paternal Grandfather   . Heart attack Paternal Grandfather   . Heart attack Maternal Grandmother   . Healthy Sister   . Healthy Brother   . Asthma Son        x2  . Asthma Maternal Grandfather     Social History   Social History  . Marital status: Divorced    Spouse name: N/A  . Number of children: N/A  . Years of education: N/A   Social History Main Topics  . Smoking status: Former Smoker    Quit date: 05/14/2014  . Smokeless tobacco: Never Used  . Alcohol use 4.2 oz/week    7 Standard drinks or equivalent per week     Comment: occasional  . Drug use: No  . Sexual activity: Yes   Other Topics Concern  . None   Social History Narrative  . None   Review of Systems - See HPI.  All other ROS are negative.  BP 128/80   Pulse 78   Temp 98.5 F (36.9 C) (Oral)   Resp 14   Ht  6' 1"  (1.854 m)   Wt 298 lb (135.2 kg)   SpO2 97%   BMI 39.32 kg/m   Physical Exam  Constitutional: He is oriented to person, place, and time and well-developed, well-nourished, and in no distress.  HENT:  Head: Normocephalic and atraumatic.  Right Ear: External ear normal.  Left Ear: External ear normal.  Nose: Nose normal.  Mouth/Throat: Oropharynx is clear and moist. No oropharyngeal exudate.  Eyes: Conjunctivae are normal.  Neck: Neck supple. No thyromegaly present.  Cardiovascular: Normal rate, regular rhythm, normal heart sounds and intact distal pulses.   Pulmonary/Chest: Effort normal and breath sounds normal. No respiratory distress. He has no wheezes. He has no rales. He exhibits no tenderness.  Musculoskeletal:       Left shoulder: Normal.       Cervical back: Normal.  Neurological: He is  alert and oriented to person, place, and time. No cranial nerve deficit.  Skin: Skin is warm and dry. No rash noted.  Psychiatric: Affect normal.  Vitals reviewed.  Recent Results (from the past 2160 hour(s))  HM DIABETES EYE EXAM     Status: None   Collection Time: 11/18/16 12:00 AM  Result Value Ref Range   HM Diabetic Eye Exam No Retinopathy No Retinopathy  Comp Met (CMET)     Status: Abnormal   Collection Time: 11/21/16  4:08 PM  Result Value Ref Range   Sodium 138 135 - 146 mmol/L   Potassium 4.4 3.5 - 5.3 mmol/L   Chloride 103 98 - 110 mmol/L   CO2 23 20 - 31 mmol/L   Glucose, Bld 178 (H) 65 - 99 mg/dL   BUN 18 7 - 25 mg/dL   Creat 1.10 0.70 - 1.33 mg/dL    Comment:   For patients > or = 53 years of age: The upper reference limit for Creatinine is approximately 13% higher for people identified as African-American.      Total Bilirubin 0.9 0.2 - 1.2 mg/dL   Alkaline Phosphatase 69 40 - 115 U/L   AST 42 (H) 10 - 35 U/L   ALT 57 (H) 9 - 46 U/L   Total Protein 7.4 6.1 - 8.1 g/dL   Albumin 4.4 3.6 - 5.1 g/dL   Calcium 9.5 8.6 - 10.3 mg/dL  Hemoglobin A1c     Status: Abnormal   Collection Time: 11/21/16  4:08 PM  Result Value Ref Range   Hgb A1c MFr Bld 8.5 (H) <5.7 %    Comment:   For someone without known diabetes, a hemoglobin A1c value of 6.5% or greater indicates that they may have diabetes and this should be confirmed with a follow-up test.   For someone with known diabetes, a value <7% indicates that their diabetes is well controlled and a value greater than or equal to 7% indicates suboptimal control. A1c targets should be individualized based on duration of diabetes, age, comorbid conditions, and other considerations.   Currently, no consensus exists for use of hemoglobin A1c for diagnosis of diabetes for children.      Mean Plasma Glucose 197 mg/dL  Basic metabolic panel     Status: Abnormal   Collection Time: 12/05/16  9:41 AM  Result Value Ref Range     Sodium 139 135 - 145 mEq/L   Potassium 4.2 3.5 - 5.1 mEq/L   Chloride 104 96 - 112 mEq/L   CO2 27 19 - 32 mEq/L   Glucose, Bld 145 (H) 70 - 99 mg/dL  BUN 21 6 - 23 mg/dL   Creatinine, Ser 1.00 0.40 - 1.50 mg/dL   Calcium 9.4 8.4 - 10.5 mg/dL   GFR 83.05 >60.00 mL/min  POCT urinalysis dipstick     Status: Abnormal   Collection Time: 01/31/17  5:28 PM  Result Value Ref Range   Color, UA yellow    Clarity, UA clear    Glucose, UA negative    Bilirubin, UA negative    Ketones, UA negative    Spec Grav, UA >=1.030 (A) 1.010 - 1.025   Blood, UA negative    pH, UA 5.5 5.0 - 8.0   Protein, UA negative    Urobilinogen, UA 0.2 0.2 or 1.0 E.U./dL   Nitrite, UA negative    Leukocytes, UA Negative Negative   Assessment/Plan: 1. Atypical chest pain EKG reveals NSR without acute changes. T wave abnormalities noted in a few precordial leads. On EKG comparison, this is new for patient. BP normotensive on recheck but was moderately elevated on initial check. Multiple elevated home BP measurements. Patients BP cuff checked in office and is fairly accurate compared to manual testing. Will check troponin level to see if there has been any acute cardiac event in the past two weeks to be cause of t wave changes. Will try to switch losartan-HCTZ for olmesartan-HCTZ at comparable dose to see if this does better for patient. Continue 81 mg ASA daily. Will need stress testing set up and cardiology assessment. Giving no acute findings, patient safe to go home. Strict ER precautions reviewed with patient.  - EKG 12-Lead - Troponin I - Basic metabolic panel  2. Urine abnormality Patient noted darker urine at end of visit. UA checked and unremarkable. Will check BMP today.  - POCT urinalysis dipstick   Leeanne Rio, PA-C

## 2017-01-31 NOTE — Progress Notes (Signed)
Pre visit review using our clinic review tool, if applicable. No additional management support is needed unless otherwise documented below in the visit note. 

## 2017-01-31 NOTE — Patient Instructions (Signed)
Please stop the losartan-HCTZ. Start the olmesartan-HCTZ daily as directed. Get a new BP cuff. Check BP daily as directed and record.  Giving EKG changes we are checking cardiac enzymes. This should be negative. If abnormal the concern is you had a cardiac event earlier in the week and we will need assessment by Cardiology.  If you have any recurrence of symptoms at all, please go to the ER or call 911. DO NOT DELAY CARE

## 2017-02-01 ENCOUNTER — Telehealth: Payer: Self-pay | Admitting: Physician Assistant

## 2017-02-01 LAB — BASIC METABOLIC PANEL
BUN: 19 mg/dL (ref 7–25)
CHLORIDE: 103 mmol/L (ref 98–110)
CO2: 24 mmol/L (ref 20–32)
CREATININE: 0.97 mg/dL (ref 0.70–1.33)
Calcium: 9.5 mg/dL (ref 8.6–10.3)
GLUCOSE: 212 mg/dL — AB (ref 65–99)
Potassium: 4.1 mmol/L (ref 3.5–5.3)
Sodium: 140 mmol/L (ref 135–146)

## 2017-02-01 LAB — EXTRA LAV TOP TUBE

## 2017-02-01 LAB — TROPONIN I

## 2017-02-01 NOTE — Telephone Encounter (Signed)
Called [facility] regarding results not being in system. Was faxed BMP and troponin results. BMP with elevated non-fasting glucose (patient diabetic) and normal renal function and electrolytes. Troponin negative.   Contacted patient with this good results. No recurrence of symptoms. We are setting him up for stress testing. Continue care discussed at visit. Follow-up next Monday/Tuesday.

## 2017-02-02 ENCOUNTER — Other Ambulatory Visit: Payer: Self-pay | Admitting: Physician Assistant

## 2017-02-02 DIAGNOSIS — R9431 Abnormal electrocardiogram [ECG] [EKG]: Secondary | ICD-10-CM

## 2017-02-02 DIAGNOSIS — R0789 Other chest pain: Secondary | ICD-10-CM

## 2017-02-02 DIAGNOSIS — Z8249 Family history of ischemic heart disease and other diseases of the circulatory system: Secondary | ICD-10-CM

## 2017-02-02 LAB — TROPONIN I: TROPONIN I: 0.01 ng/mL (ref <=0.0)

## 2017-02-05 ENCOUNTER — Ambulatory Visit (INDEPENDENT_AMBULATORY_CARE_PROVIDER_SITE_OTHER): Payer: 59 | Admitting: Cardiology

## 2017-02-05 ENCOUNTER — Encounter: Payer: Self-pay | Admitting: Cardiology

## 2017-02-05 VITALS — BP 142/90 | HR 87 | Ht 73.0 in | Wt 300.1 lb

## 2017-02-05 DIAGNOSIS — I10 Essential (primary) hypertension: Secondary | ICD-10-CM | POA: Diagnosis not present

## 2017-02-05 DIAGNOSIS — Z8249 Family history of ischemic heart disease and other diseases of the circulatory system: Secondary | ICD-10-CM

## 2017-02-05 DIAGNOSIS — R9431 Abnormal electrocardiogram [ECG] [EKG]: Secondary | ICD-10-CM | POA: Diagnosis not present

## 2017-02-05 DIAGNOSIS — E782 Mixed hyperlipidemia: Secondary | ICD-10-CM

## 2017-02-05 DIAGNOSIS — E1100 Type 2 diabetes mellitus with hyperosmolarity without nonketotic hyperglycemic-hyperosmolar coma (NKHHC): Secondary | ICD-10-CM

## 2017-02-05 DIAGNOSIS — R079 Chest pain, unspecified: Secondary | ICD-10-CM | POA: Diagnosis not present

## 2017-02-05 NOTE — Patient Instructions (Addendum)
Medication Instructions:   Your physician recommends that you continue on your current medications as directed. Please refer to the Current Medication list given to you today.   Labwork:  Your physician recommends that you return for lab work in: Today ( BMP, LIVER,LIPID)   Testing/Procedures:  Your physician has requested that you have en exercise stress myoview. For further information please visit [email or web address]. Please follow instruction sheet, as given.  This test will be done at our main office at [address], [place], [place], [place] [address]    Follow-Up:  Your physician recommends that you schedule a follow-up appointment in: 6 weeks with Dr. Geraldo Pitter.    Any Other Special Instructions Will Be Listed Below (If Applicable).     If you need a refill on your cardiac medications before your next appointment, please call your pharmacy.

## 2017-02-05 NOTE — Progress Notes (Signed)
Cardiology Office Note:    Date:  02/05/2017   ID:  Louis Davis, DOB 1965/09/30, MRN [id number]  PCP:  Brunetta Jeans, PA-C  Cardiologist:  Jenean Lindau, MD   Referring MD: Brunetta Jeans, PA-C    ASSESSMENT:    1. Chest pain, unspecified type   2. Abnormal electrocardiogram   3. Essential hypertension, benign   4. Uncontrolled type 2 diabetes mellitus with hyperosmolarity without coma, unspecified whether long term insulin use ([place])   5. Family history of early CAD   46. Mixed dyslipidemia    PLAN:    In order of problems listed above:  1. The patient has atypical chest pain but in view of multiple risk factors he will undergo an exercise nuclear stress testing. Primary prevention stressed to the patient. Importance of compliance with diet and medications stressed and he vocalized understanding 2. He was to me that his blood pressure stable and he will continue to monitor. He has an element of whitecoat hypertension. 3. Diet was discussed for diabetes mellitus and this is managed by his primary care physician. Also diet was discussed for obesity and risks of obesity explained and he verbalized understanding. 4. Patient was explained about the lipids is extensive length. I mentioned to him that being a diabetic it is better that he takes the higher dose of aspirin and he is agreeable. He will also be Chem-7 liver lipids checked today.Again by the guidelines he needs to be on statin therapy and is agreeable. We will review his lipids and advise accordingly. He will be seen in follow-up appointment in 6 weeks or earlier if he has any concerns. He knows to go to the nearest emergency room for any concerning symptoms.   Medication Adjustments/Labs and Tests Ordered: Current medicines are reviewed at length with the patient today.  Concerns regarding medicines are outlined above.  Orders Placed This Encounter  Procedures  . Basic metabolic panel  . Hepatic function panel    . Lipid panel  . Myocardial Perfusion Imaging   No orders of the defined types were placed in this encounter.    History of Present Illness:    Louis Davis is a 51 y.o. male who is being seen today for the evaluation of chest pain at the request of Delorse Limber. Patient is a pleasant 51 year old male. He has past medical history of essential hypertension him and diabetes mellitus which is not under good control, dyslipidemia and obesity.patient mentions to me that occasionally has chest pain not related to exertion. He leads a sedentary lifestyle. It's knife stabbing-like in nature. It is not related to exertion.He leads a sedentary lifestyle.At the time of my evaluation is alert awake oriented and in no distress. These symptoms do not radiate to any parts of the body including the arm or the neck.  Past Medical History:  Diagnosis Date  . Chicken pox   . Gout   . HTN (hypertension)   . Hx of diabetes mellitus    Resolved  . Skin lesion    Benign    Past Surgical History:  Procedure Laterality Date  . ARTERY EXPLORATION     Right Forearm  . TONSILLECTOMY AND ADENOIDECTOMY  1973    Current Medications: Current Meds  Medication Sig  . allopurinol (ZYLOPRIM) 100 MG tablet TAKE 1 TABLET BY MOUTH EVERY DAY  . aspirin 81 MG tablet Take 81 mg by mouth daily.  . Chromium Picolinate 500 MCG CAPS Take 2  capsules by mouth daily.   . metFORMIN (GLUCOPHAGE) 1000 MG tablet TAKE 1 TABLET BY MOUTH TWICE A DAY WITH MEALS  . olmesartan-hydrochlorothiazide (BENICAR HCT) 40-25 MG tablet Take 1 tablet by mouth daily.   Current Facility-Administered Medications for the 02/05/17 encounter (Office Visit) with Revankar, Reita Cliche, MD  Medication  . triamcinolone acetonide (KENALOG) 10 MG/ML injection 10 mg     Allergies:   Penicillins   Social History   Social History  . Marital status: Divorced    Spouse name: N/A  . Number of children: N/A  . Years of education: N/A    Social History Main Topics  . Smoking status: Former Smoker    Quit date: 05/14/2014  . Smokeless tobacco: Never Used  . Alcohol use 4.2 oz/week    7 Standard drinks or equivalent per week     Comment: occasional  . Drug use: No  . Sexual activity: Yes   Other Topics Concern  . None   Social History Narrative  . None     Family History: The patient's family history includes Arthritis-Osteo in his mother; Asthma in his maternal grandfather, mother, and son; Breast cancer in his mother; Colon cancer in his mother; Diabetes in his father; Healthy in his brother and sister; Heart attack in his father, maternal grandmother, and paternal grandfather; Heart disease in his paternal grandfather; Heart disease (age of onset: 43) in his father; Hypertension in his father and mother; Stroke in his mother.  ROS:   Please see the history of present illness.    All other systems reviewed and are negative.  EKGs/Labs/Other Studies Reviewed:    The following studies were reviewed today: I discussed my findings including EKG findings with the patient at extensive length. Questions were answered to his satisfaction. I reviewed his lipids extensively.   Recent Labs: 07/18/2016: Hemoglobin 16.4; Platelets 208.0; TSH 2.10 11/21/2016: ALT 57 01/31/2017: BUN 19; Creat 0.97; Potassium 4.1; Sodium 140  Recent Lipid Panel    Component Value Date/Time   CHOL 194 07/18/2016 1054   TRIG 275.0 (H) 07/18/2016 1054   HDL 36.80 (L) 07/18/2016 1054   CHOLHDL 5 07/18/2016 1054   VLDL 55.0 (H) 07/18/2016 1054   LDLCALC 93 09/12/2013 0938   LDLDIRECT 120.0 07/18/2016 1054    Physical Exam:    VS:  BP (!) 142/90   Pulse 87   Ht 6\' 1"  (1.854 m)   Wt (!) 300 lb 1.9 oz (136.1 kg)   SpO2 97%   BMI 39.60 kg/m     Wt Readings from Last 3 Encounters:  02/05/17 (!) 300 lb 1.9 oz (136.1 kg)  01/31/17 298 lb (135.2 kg)  12/18/16 300 lb (136.1 kg)     GEN: Patient is in no acute distress HEENT:  Normal NECK: No JVD; No carotid bruits LYMPHATICS: No lymphadenopathy CARDIAC: S1 S2 regular, 2/6 systolic murmur at the apex. RESPIRATORY:  Clear to auscultation without rales, wheezing or rhonchi  ABDOMEN: Soft, non-tender, non-distended MUSCULOSKELETAL:  No edema; No deformity  SKIN: Warm and dry NEUROLOGIC:  Alert and oriented x 3 PSYCHIATRIC:  Normal affect    Signed, Jenean Lindau, MD  02/05/2017 11:31 AM    [place]

## 2017-02-06 ENCOUNTER — Other Ambulatory Visit: Payer: 59 | Admitting: Orthotics

## 2017-02-06 LAB — BASIC METABOLIC PANEL
BUN/Creatinine Ratio: 10 (ref 9–20)
BUN: 11 mg/dL (ref 6–24)
CHLORIDE: 100 mmol/L (ref 96–106)
CO2: 23 mmol/L (ref 20–29)
CREATININE: 1.08 mg/dL (ref 0.76–1.27)
Calcium: 9.4 mg/dL (ref 8.7–10.2)
GFR calc non Af Amer: 78 mL/min/{1.73_m2} (ref >59)
GFR, EST AFRICAN AMERICAN: 90 mL/min/{1.73_m2} (ref >59)
GLUCOSE: 158 mg/dL — AB (ref 65–99)
Potassium: 3.9 mmol/L (ref 3.5–5.2)
Sodium: 140 mmol/L (ref 134–144)

## 2017-02-06 LAB — LIPID PANEL
CHOL/HDL RATIO: 4.2 ratio (ref 0.0–5.0)
Cholesterol, Total: 168 mg/dL (ref 100–199)
HDL: 40 mg/dL (ref >39)
LDL Calculated: 89 mg/dL (ref 0–99)
TRIGLYCERIDES: 195 mg/dL — AB (ref 0–149)
VLDL CHOLESTEROL CAL: 39 mg/dL (ref 5–40)

## 2017-02-06 LAB — HEPATIC FUNCTION PANEL
ALBUMIN: 3.9 g/dL (ref 3.5–5.5)
ALT: 58 IU/L — AB (ref 0–44)
AST: 39 IU/L (ref 0–40)
Alkaline Phosphatase: 63 IU/L (ref 39–117)
BILIRUBIN TOTAL: 0.6 mg/dL (ref 0.0–1.2)
Bilirubin, Direct: 0.17 mg/dL (ref 0.00–0.40)
Total Protein: 6.9 g/dL (ref 6.0–8.5)

## 2017-02-07 ENCOUNTER — Other Ambulatory Visit: Payer: Self-pay | Admitting: Physician Assistant

## 2017-02-07 ENCOUNTER — Telehealth: Payer: Self-pay

## 2017-02-07 DIAGNOSIS — E785 Hyperlipidemia, unspecified: Secondary | ICD-10-CM

## 2017-02-07 MED ORDER — ATORVASTATIN CALCIUM 10 MG PO TABS: 10.0000 mg | ORAL_TABLET | Freq: Every day | ORAL | 3 refills | Status: DC

## 2017-02-07 NOTE — Telephone Encounter (Signed)
Atorvastatin 10 mg once daily sent to Pharmacy per RRR

## 2017-02-13 ENCOUNTER — Other Ambulatory Visit: Payer: Self-pay | Admitting: Emergency Medicine

## 2017-02-13 MED ORDER — METFORMIN HCL 1000 MG PO TABS: 1000.0000 mg | ORAL_TABLET | Freq: Two times a day (BID) | ORAL | 0 refills | Status: DC

## 2017-03-01 ENCOUNTER — Telehealth (HOSPITAL_COMMUNITY): Payer: Self-pay | Admitting: *Deleted

## 2017-03-01 NOTE — Telephone Encounter (Signed)
Left message on voicemail per DPR in reference to upcoming appointment scheduled on 03/06/17 with detailed instructions given per Myocardial Perfusion Study Information Sheet for the test. LM to arrive 15 minutes early, and that it is imperative to arrive on time for appointment to keep from having the test rescheduled. If you need to cancel or reschedule your appointment, please call the office within 24 hours of your appointment. Failure to do so may result in a cancellation of your appointment, and a $50 no show fee. Phone number given for call back for any questions. [name]

## 2017-03-06 ENCOUNTER — Ambulatory Visit (HOSPITAL_COMMUNITY): Payer: 59 | Attending: Cardiovascular Disease

## 2017-03-06 DIAGNOSIS — I119 Hypertensive heart disease without heart failure: Secondary | ICD-10-CM | POA: Insufficient documentation

## 2017-03-06 DIAGNOSIS — R9431 Abnormal electrocardiogram [ECG] [EKG]: Secondary | ICD-10-CM | POA: Diagnosis not present

## 2017-03-06 DIAGNOSIS — I2510 Atherosclerotic heart disease of native coronary artery without angina pectoris: Secondary | ICD-10-CM | POA: Diagnosis not present

## 2017-03-06 DIAGNOSIS — Z8249 Family history of ischemic heart disease and other diseases of the circulatory system: Secondary | ICD-10-CM | POA: Diagnosis not present

## 2017-03-06 DIAGNOSIS — E119 Type 2 diabetes mellitus without complications: Secondary | ICD-10-CM | POA: Diagnosis not present

## 2017-03-06 DIAGNOSIS — R079 Chest pain, unspecified: Secondary | ICD-10-CM | POA: Diagnosis present

## 2017-03-06 MED ORDER — TECHNETIUM TC 99M TETROFOSMIN IV KIT
33.0000 | PACK | Freq: Once | INTRAVENOUS | Status: AC | PRN
  Administered 2017-03-06: 33 via INTRAVENOUS
  Filled 2017-03-06: qty 33

## 2017-03-07 ENCOUNTER — Ambulatory Visit (HOSPITAL_COMMUNITY): Payer: 59 | Attending: Cardiovascular Disease

## 2017-03-07 LAB — MYOCARDIAL PERFUSION IMAGING
CHL CUP NUCLEAR SRS: 2
CHL CUP NUCLEAR SSS: 3
CHL CUP RESTING HR STRESS: 72 {beats}/min
CSEPED: 5 min
CSEPEDS: 52 s
CSEPHR: 92 %
Estimated workload: 7 METS
LV dias vol: 136 mL (ref 62–150)
LVSYSVOL: 72 mL
MPHR: 167 {beats}/min
Peak HR: 155 {beats}/min
RATE: 0.26
SDS: 1
TID: 0.99

## 2017-03-07 MED ORDER — TECHNETIUM TC 99M TETROFOSMIN IV KIT
31.6000 | PACK | Freq: Once | INTRAVENOUS | Status: AC | PRN
  Administered 2017-03-07: 31.6 via INTRAVENOUS
  Filled 2017-03-07: qty 32

## 2017-03-19 ENCOUNTER — Encounter: Payer: Self-pay | Admitting: Cardiology

## 2017-03-19 ENCOUNTER — Ambulatory Visit (INDEPENDENT_AMBULATORY_CARE_PROVIDER_SITE_OTHER): Payer: 59 | Admitting: Cardiology

## 2017-03-19 VITALS — BP 122/76 | HR 85 | Ht 73.0 in | Wt 297.0 lb

## 2017-03-19 DIAGNOSIS — E782 Mixed hyperlipidemia: Secondary | ICD-10-CM

## 2017-03-19 DIAGNOSIS — E088 Diabetes mellitus due to underlying condition with unspecified complications: Secondary | ICD-10-CM | POA: Diagnosis not present

## 2017-03-19 DIAGNOSIS — I10 Essential (primary) hypertension: Secondary | ICD-10-CM | POA: Diagnosis not present

## 2017-03-19 NOTE — Progress Notes (Signed)
Cardiology Office Note:    Date:  03/19/2017   ID:  Louis Davis, DOB 01-Dec-1965, MRN [id number]  PCP:  Brunetta Jeans, PA-C  Cardiologist:  Jenean Lindau, Louis Davis   Referring Louis Davis: Brunetta Jeans, PA-C    ASSESSMENT:    1. Essential hypertension, benign   2. Mixed dyslipidemia   3. Diabetes mellitus due to underlying condition with complication, without long-term current use of insulin ([facility])    PLAN:    In order of problems listed above:  1. Primary prevention stressed to the patient. Importance of compliance with diet and medications stressed and he verbalized understanding. His blood pressure stable. Lipids were reviewed and is happy with this. He is going to do better as far as diet and exercise is concerned. 2. Results of the test were discussed with the patient. Stress test results were discussed. Patient's blood pressure stable. He will be seen in follow-up appointment in 3 months or earlier if he has any concerns. Diet was discussed for obesity and overweight and the risks were explained to him and he plans to exercise and diet and lose weight. Appointment in 3 months or earlier if he has any concerns.   Medication Adjustments/Labs and Tests Ordered: Current medicines are reviewed at length with the patient today.  Concerns regarding medicines are outlined above.  No orders of the defined types were placed in this encounter.  No orders of the defined types were placed in this encounter.    Chief Complaint  Patient presents with  . Follow-up    DOING WELL SINCE LAST OFFICE.Marland Kitchen      History of Present Illness:    Louis Davis is a 51 y.o. male . He was evaluated by me for essential hypertension and dyslipidemia. He denies any problems at this time and is here for activities of daily living. No chest pain orthopnea or PND. At the time of my evaluation he is alert awake oriented and in no distress.  Past Medical History:  Diagnosis Date  . Chicken pox   . Gout    . HTN (hypertension)   . Hx of diabetes mellitus    Resolved  . Skin lesion    Benign    Past Surgical History:  Procedure Laterality Date  . ARTERY EXPLORATION     Right Forearm  . TONSILLECTOMY AND ADENOIDECTOMY  1973    Current Medications: Current Meds  Medication Sig  . allopurinol (ZYLOPRIM) 100 MG tablet TAKE 1 TABLET BY MOUTH EVERY DAY  . aspirin 81 MG tablet Take 81 mg by mouth daily.  Marland Kitchen atorvastatin (LIPITOR) 10 MG tablet Take 1 tablet (10 mg total) by mouth daily.  . Chromium Picolinate 500 MCG CAPS Take 2 capsules by mouth daily.   . metFORMIN (GLUCOPHAGE) 1000 MG tablet Take 1 tablet (1,000 mg total) by mouth 2 (two) times daily with a meal.  . olmesartan-hydrochlorothiazide (BENICAR HCT) 40-25 MG tablet Take 1 tablet by mouth daily.   Current Facility-Administered Medications for the 03/19/17 encounter (Office Visit) with Louis Davis, Louis Cliche, Louis Davis  Medication  . triamcinolone acetonide (KENALOG) 10 MG/ML injection 10 mg     Allergies:   Penicillins   Social History   Social History  . Marital status: Divorced    Spouse name: N/A  . Number of children: N/A  . Years of education: N/A   Social History Main Topics  . Smoking status: Former Smoker    Quit date: 05/14/2014  . Smokeless tobacco: Never  Used  . Alcohol use 4.2 oz/week    7 Standard drinks or equivalent per week     Comment: occasional  . Drug use: No  . Sexual activity: Yes   Other Topics Concern  . None   Social History Narrative  . None     Family History: The patient's family history includes Arthritis-Osteo in his mother; Asthma in his maternal grandfather, mother, and son; Breast cancer in his mother; Colon cancer in his mother; Diabetes in his father; Healthy in his brother and sister; Heart attack in his father, maternal grandmother, and paternal grandfather; Heart disease in his paternal grandfather; Heart disease (age of onset: 60) in his father; Hypertension in his father and  mother; Stroke in his mother.  ROS:   Please see the history of present illness.    All other systems reviewed and are negative.  EKGs/Labs/Other Studies Reviewed:    The following studies were reviewed today: I discussed the findings of the test with the patient at extensive length and he vocalized understanding.   Recent Labs: 07/18/2016: Hemoglobin 16.4; Platelets 208.0; TSH 2.10 02/05/2017: ALT 58; BUN 11; Creatinine, Ser 1.08; Potassium 3.9; Sodium 140  Recent Lipid Panel    Component Value Date/Time   CHOL 168 02/05/2017 1001   TRIG 195 (H) 02/05/2017 1001   HDL 40 02/05/2017 1001   CHOLHDL 4.2 02/05/2017 1001   CHOLHDL 5 07/18/2016 1054   VLDL 55.0 (H) 07/18/2016 1054   LDLCALC 89 02/05/2017 1001   LDLDIRECT 120.0 07/18/2016 1054    Physical Exam:    VS:  BP 122/76   Pulse 85   Ht 6\' 1"  (1.854 m)   Wt 297 lb (134.7 kg)   SpO2 96%   BMI 39.18 kg/m     Wt Readings from Last 3 Encounters:  03/19/17 297 lb (134.7 kg)  03/06/17 300 lb (136.1 kg)  02/05/17 (!) 300 lb 1.9 oz (136.1 kg)     GEN: Patient is in no acute distress HEENT: Normal NECK: No JVD; No carotid bruits LYMPHATICS: No lymphadenopathy CARDIAC: Hear sounds regular, 2/6 systolic murmur at the apex. RESPIRATORY:  Clear to auscultation without rales, wheezing or rhonchi  ABDOMEN: Soft, non-tender, non-distended MUSCULOSKELETAL:  No edema; No deformity  SKIN: Warm and dry NEUROLOGIC:  Alert and oriented x 3 PSYCHIATRIC:  Normal affect   Signed, Jenean Lindau, Louis Davis  03/19/2017 10:45 AM    [place]

## 2017-03-19 NOTE — Patient Instructions (Signed)
Medication Instructions:  Your physician recommends that you continue on your current medications as directed. Please refer to the Current Medication list given to you today.   Labwork: None  Testing/Procedures: None  Follow-Up: Your physician recommends that you schedule a follow-up appointment in: 3 months   Any Other Special Instructions Will Be Listed Below (If Applicable).     If you need a refill on your cardiac medications before your next appointment, please call your pharmacy.   [place], RN, BSN   DASH Eating Plan DASH stands for "Dietary Approaches to Stop Hypertension." The DASH eating plan is a healthy eating plan that has been shown to reduce high blood pressure (hypertension). It may also reduce your risk for type 2 diabetes, heart disease, and stroke. The DASH eating plan may also help with weight loss. What are tips for following this plan? General guidelines  Avoid eating more than 2,300 mg (milligrams) of salt (sodium) a day. If you have hypertension, you may need to reduce your sodium intake to 1,500 mg a day.  Limit alcohol intake to no more than 1 drink a day for nonpregnant women and 2 drinks a day for men. One drink equals 12 oz of beer, 5 oz of wine, or 1 oz of hard liquor.  Work with your health care provider to maintain a healthy body weight or to lose weight. Ask what an ideal weight is for you.  Get at least 30 minutes of exercise that causes your heart to beat faster (aerobic exercise) most days of the week. Activities may include walking, swimming, or biking.  Work with your health care provider or diet and nutrition specialist (dietitian) to adjust your eating plan to your individual calorie needs. Reading food labels  Check food labels for the amount of sodium per serving. Choose foods with less than 5 percent of the Daily Value of sodium. Generally, foods with less than 300 mg of sodium per serving fit into this eating  plan.  To find whole grains, look for the word "whole" as the first word in the ingredient list. Shopping  Buy products labeled as "low-sodium" or "no salt added."  Buy fresh foods. Avoid canned foods and premade or frozen meals. Cooking  Avoid adding salt when cooking. Use salt-free seasonings or herbs instead of table salt or sea salt. Check with your health care provider or pharmacist before using salt substitutes.  Do not fry foods. Cook foods using healthy methods such as baking, boiling, grilling, and broiling instead.  Cook with heart-healthy oils, such as olive, canola, soybean, or sunflower oil. Meal planning   Eat a balanced diet that includes: ? 5 or more servings of fruits and vegetables each day. At each meal, try to fill half of your plate with fruits and vegetables. ? Up to 6-8 servings of whole grains each day. ? Less than 6 oz of lean meat, poultry, or fish each day. A 3-oz serving of meat is about the same size as a deck of cards. One egg equals 1 oz. ? 2 servings of low-fat dairy each day. ? A serving of nuts, seeds, or beans 5 times each week. ? Heart-healthy fats. Healthy fats called Omega-3 fatty acids are found in foods such as flaxseeds and coldwater fish, like sardines, salmon, and mackerel.  Limit how much you eat of the following: ? Canned or prepackaged foods. ? Food that is high in trans fat, such as fried foods. ? Food that is  high in saturated fat, such as fatty meat. ? Sweets, desserts, sugary drinks, and other foods with added sugar. ? Full-fat dairy products.  Do not salt foods before eating.  Try to eat at least 2 vegetarian meals each week.  Eat more home-cooked food and less restaurant, buffet, and fast food.  When eating at a restaurant, ask that your food be prepared with less salt or no salt, if possible. What foods are recommended? The items listed may not be a complete list. Talk with your dietitian about what dietary choices are best  for you. Grains Whole-grain or whole-wheat bread. Whole-grain or whole-wheat pasta. Brown rice. [place]. Bulgur. Whole-grain and low-sodium cereals. Pita bread. Low-fat, low-sodium crackers. Whole-wheat flour tortillas. Vegetables Fresh or frozen vegetables (raw, steamed, roasted, or grilled). Low-sodium or reduced-sodium tomato and vegetable juice. Low-sodium or reduced-sodium tomato sauce and tomato paste. Low-sodium or reduced-sodium canned vegetables. Fruits All fresh, dried, or frozen fruit. Canned fruit in natural juice (without added sugar). Meat and other protein foods Skinless chicken or [place]. Ground chicken or [place]. Pork with fat trimmed off. Fish and seafood. Egg whites. Dried beans, peas, or lentils. Unsalted nuts, nut butters, and seeds. Unsalted canned beans. Lean cuts of beef with fat trimmed off. Low-sodium, lean deli meat. Dairy Low-fat (1%) or fat-free (skim) milk. Fat-free, low-fat, or reduced-fat cheeses. Nonfat, low-sodium ricotta or cottage cheese. Low-fat or nonfat yogurt. Low-fat, low-sodium cheese. Fats and oils Soft margarine without trans fats. Vegetable oil. Low-fat, reduced-fat, or light mayonnaise and salad dressings (reduced-sodium). Canola, safflower, olive, soybean, and sunflower oils. Avocado. Seasoning and other foods Herbs. Spices. Seasoning mixes without salt. Unsalted popcorn and pretzels. Fat-free sweets. What foods are not recommended? The items listed may not be a complete list. Talk with your dietitian about what dietary choices are best for you. Grains Baked goods made with fat, such as croissants, muffins, or some breads. Dry pasta or rice meal packs. Vegetables Creamed or fried vegetables. Vegetables in a cheese sauce. Regular canned vegetables (not low-sodium or reduced-sodium). Regular canned tomato sauce and paste (not low-sodium or reduced-sodium). Regular tomato and vegetable juice (not low-sodium or reduced-sodium). Angie Fava.  Olives. Fruits Canned fruit in a light or heavy syrup. Fried fruit. Fruit in cream or butter sauce. Meat and other protein foods Fatty cuts of meat. Ribs. Fried meat. Berniece Salines. Sausage. Bologna and other processed lunch meats. Salami. Fatback. Hotdogs. Bratwurst. Salted nuts and seeds. Canned beans with added salt. Canned or smoked fish. Whole eggs or egg yolks. Chicken or [place] with skin. Dairy Whole or 2% milk, cream, and half-and-half. Whole or full-fat cream cheese. Whole-fat or sweetened yogurt. Full-fat cheese. Nondairy creamers. Whipped toppings. Processed cheese and cheese spreads. Fats and oils Butter. Stick margarine. Lard. Shortening. Ghee. Bacon fat. Tropical oils, such as coconut, palm kernel, or palm oil. Seasoning and other foods Salted popcorn and pretzels. Onion salt, garlic salt, seasoned salt, table salt, and sea salt. Worcestershire sauce. Tartar sauce. Barbecue sauce. Teriyaki sauce. Soy sauce, including reduced-sodium. Steak sauce. Canned and packaged gravies. Fish sauce. Oyster sauce. Cocktail sauce. Horseradish that you find on the shelf. Ketchup. Mustard. Meat flavorings and tenderizers. Bouillon cubes. Hot sauce and Tabasco sauce. Premade or packaged marinades. Premade or packaged taco seasonings. Relishes. Regular salad dressings. Where to find more information:  National Heart, Lung, and [place]: https://wilson-eaton.com/  American Heart Association: www.heart.org Summary  The DASH eating plan is a healthy eating plan that has been shown to reduce high blood pressure (hypertension). It  may also reduce your risk for type 2 diabetes, heart disease, and stroke.  With the DASH eating plan, you should limit salt (sodium) intake to 2,300 mg a day. If you have hypertension, you may need to reduce your sodium intake to 1,500 mg a day.  When on the DASH eating plan, aim to eat more fresh fruits and vegetables, whole grains, lean proteins, low-fat dairy, and heart-healthy  fats.  Work with your health care provider or diet and nutrition specialist (dietitian) to adjust your eating plan to your individual calorie needs. This information is not intended to replace advice given to you by your health care provider. Make sure you discuss any questions you have with your health care provider. Document Released: 04/27/2011 Document Revised: 05/01/2016 Document Reviewed: 05/01/2016 Elsevier Interactive Patient Education  2017 [employer].

## 2017-03-21 ENCOUNTER — Other Ambulatory Visit: Payer: Self-pay | Admitting: Physician Assistant

## 2017-03-31 LAB — HM DIABETES EYE EXAM

## 2017-06-03 ENCOUNTER — Other Ambulatory Visit: Payer: Self-pay | Admitting: Physician Assistant

## 2017-07-17 ENCOUNTER — Encounter: Payer: Self-pay | Admitting: Emergency Medicine

## 2017-09-12 ENCOUNTER — Other Ambulatory Visit: Payer: Self-pay | Admitting: Physician Assistant

## 2017-09-12 NOTE — Telephone Encounter (Signed)
OV: 01/31/2017 Fill Date 02/2017

## 2017-10-06 ENCOUNTER — Other Ambulatory Visit: Payer: Self-pay | Admitting: Physician Assistant

## 2017-10-16 ENCOUNTER — Ambulatory Visit: Payer: BLUE CROSS/BLUE SHIELD | Admitting: Physician Assistant

## 2017-10-16 ENCOUNTER — Other Ambulatory Visit: Payer: Self-pay

## 2017-10-16 ENCOUNTER — Encounter: Payer: Self-pay | Admitting: Physician Assistant

## 2017-10-16 VITALS — BP 120/66 | HR 72 | Temp 97.8°F | Resp 16 | Ht 73.0 in | Wt 299.6 lb

## 2017-10-16 DIAGNOSIS — I10 Essential (primary) hypertension: Secondary | ICD-10-CM

## 2017-10-16 DIAGNOSIS — E118 Type 2 diabetes mellitus with unspecified complications: Secondary | ICD-10-CM

## 2017-10-16 DIAGNOSIS — Z1159 Encounter for screening for other viral diseases: Secondary | ICD-10-CM | POA: Diagnosis not present

## 2017-10-16 DIAGNOSIS — Z1211 Encounter for screening for malignant neoplasm of colon: Secondary | ICD-10-CM

## 2017-10-16 DIAGNOSIS — Z125 Encounter for screening for malignant neoplasm of prostate: Secondary | ICD-10-CM | POA: Diagnosis not present

## 2017-10-16 DIAGNOSIS — M1A00X0 Idiopathic chronic gout, unspecified site, without tophus (tophi): Secondary | ICD-10-CM

## 2017-10-16 DIAGNOSIS — Z0000 Encounter for general adult medical examination without abnormal findings: Secondary | ICD-10-CM | POA: Diagnosis not present

## 2017-10-16 LAB — COMPREHENSIVE METABOLIC PANEL
ALT: 50 U/L (ref 0–53)
AST: 44 U/L — ABNORMAL HIGH (ref 0–37)
Albumin: 4.1 g/dL (ref 3.5–5.2)
Alkaline Phosphatase: 50 U/L (ref 39–117)
BILIRUBIN TOTAL: 1.1 mg/dL (ref 0.2–1.2)
BUN: 20 mg/dL (ref 6–23)
CALCIUM: 9.5 mg/dL (ref 8.4–10.5)
CO2: 26 meq/L (ref 19–32)
Chloride: 102 mEq/L (ref 96–112)
Creatinine, Ser: 1.02 mg/dL (ref 0.40–1.50)
GFR: 80.91 mL/min (ref >60.00)
Glucose, Bld: 172 mg/dL — ABNORMAL HIGH (ref 70–99)
Potassium: 4 mEq/L (ref 3.5–5.1)
Sodium: 138 mEq/L (ref 135–145)
Total Protein: 7.2 g/dL (ref 6.0–8.3)

## 2017-10-16 LAB — CBC WITH DIFFERENTIAL/PLATELET
BASOS PCT: 1.2 % (ref 0.0–3.0)
Basophils Absolute: 0.1 10*3/uL (ref 0.0–0.1)
Eosinophils Absolute: 0.5 10*3/uL (ref 0.0–0.7)
Eosinophils Relative: 5.6 % — ABNORMAL HIGH (ref 0.0–5.0)
HEMATOCRIT: 48 % (ref 39.0–52.0)
Hemoglobin: 16.5 g/dL (ref 13.0–17.0)
LYMPHS PCT: 25.4 % (ref 12.0–46.0)
Lymphs Abs: 2.5 10*3/uL (ref 0.7–4.0)
MCHC: 34.4 g/dL (ref 30.0–36.0)
MCV: 93.4 fl (ref 78.0–100.0)
Monocytes Absolute: 0.6 10*3/uL (ref 0.1–1.0)
Monocytes Relative: 6.2 % (ref 3.0–12.0)
NEUTROS ABS: 6.1 10*3/uL (ref 1.4–7.7)
Neutrophils Relative %: 61.6 % (ref 43.0–77.0)
PLATELETS: 200 10*3/uL (ref 150.0–400.0)
RBC: 5.14 Mil/uL (ref 4.22–5.81)
RDW: 13.4 % (ref 11.5–15.5)
WBC: 9.9 10*3/uL (ref 4.0–10.5)

## 2017-10-16 LAB — LIPID PANEL
CHOLESTEROL: 119 mg/dL (ref 0–200)
HDL: 37.2 mg/dL — ABNORMAL LOW (ref >39.00)
LDL Cholesterol: 55 mg/dL (ref 0–99)
NonHDL: 81.54
Total CHOL/HDL Ratio: 3
Triglycerides: 131 mg/dL (ref 0.0–149.0)
VLDL: 26.2 mg/dL (ref 0.0–40.0)

## 2017-10-16 LAB — PSA: PSA: 0.18 ng/mL (ref 0.10–4.00)

## 2017-10-16 LAB — HEMOGLOBIN A1C: HEMOGLOBIN A1C: 8.2 % — AB (ref 4.6–6.5)

## 2017-10-16 NOTE — Assessment & Plan Note (Signed)
Doing very well. Continue current regimen.  

## 2017-10-16 NOTE — Assessment & Plan Note (Signed)
Order for Hep C antibody placed.

## 2017-10-16 NOTE — Progress Notes (Signed)
Patient presents to clinic today for annual exam.  Patient is fasting for labs.  Diet -- Has cut out most carbs from diet. Is eating more fruits and vegetables.  Exercise -- Is working on this. Trying to increase the amount of cardio per week.  Body mass index is 39.53 kg/m.  Chronic Issues: Hypertension -- Patient is currently on a regimen of Benazepril-HCT. Is taking as directed. Patient denies chest pain, palpitations, lightheadedness, dizziness, vision changes or frequent headaches.  Hyperlipidemia -- Followed by Cardiology. Has had negative stress testing. Is currently on statin therapy and taking as directed.   Diabetes Mellitus II -- Is currently on a regimen of Metformin 1000 mg BID. Is only taking once daily. Is having a hard time remembering to take in the evening due to work schedule. Is not checking sugar levels regularly. Eye exam and immunizations are up-to-date. Denies vision changed of foot concerns. Is overdue for foot exam. No history of retinopathy, neuropathy or nephropathy. Diet as noted above.    Gout -- Taking Allopurinol daily as directed. No flare up in a year. Notes he is doing very well.  Health Maintenance: Immunizations -- up-to-date.  Colonoscopy -- Declines colonoscopy.   Past Medical History:  Diagnosis Date  . Chicken pox   . Gout   . HTN (hypertension)   . Hx of diabetes mellitus    Resolved  . Skin lesion    Benign    Past Surgical History:  Procedure Laterality Date  . ARTERY EXPLORATION     Right Forearm  . TONSILLECTOMY AND ADENOIDECTOMY  1973    Current Outpatient Medications on File Prior to Visit  Medication Sig Dispense Refill  . allopurinol (ZYLOPRIM) 100 MG tablet TAKE 1 TABLET BY MOUTH EVERY DAY 30 tablet 0  . aspirin 81 MG tablet Take 81 mg by mouth daily.    . Chromium Picolinate 500 MCG CAPS Take 2 capsules by mouth daily.     . metFORMIN (GLUCOPHAGE) 1000 MG tablet Take 1 tablet (1,000 mg total) by mouth 2 (two) times  daily with a meal. 180 tablet 0  . olmesartan-hydrochlorothiazide (BENICAR HCT) 40-25 MG tablet TAKE 1 TABLET BY MOUTH EVERY DAY 30 tablet 3  . atorvastatin (LIPITOR) 10 MG tablet Take 1 tablet (10 mg total) by mouth daily. 90 tablet 3   Current Facility-Administered Medications on File Prior to Visit  Medication Dose Route Frequency Provider Last Rate Last Dose  . triamcinolone acetonide (KENALOG) 10 MG/ML injection 10 mg  10 mg Other Once Wallene Huh, DPM        Allergies  Allergen Reactions  . Penicillins Anaphylaxis    Family History  Problem Relation Age of Onset  . Heart disease Father 41       Deceased  . Heart attack Father   . Hypertension Father   . Diabetes Father   . Arthritis-Osteo Mother        Living  . Colon cancer Mother   . Stroke Mother   . Hypertension Mother   . Breast cancer Mother   . Asthma Mother   . Heart disease Paternal Grandfather   . Heart attack Paternal Grandfather   . Heart attack Maternal Grandmother   . Healthy Sister   . Healthy Brother   . Asthma Son        x2  . Asthma Maternal Grandfather     Social History   Socioeconomic History  . Marital status: Divorced    Spouse  name: Not on file  . Number of children: Not on file  . Years of education: Not on file  . Highest education level: Not on file  Occupational History  . Not on file  Social Needs  . Financial resource strain: Not on file  . Food insecurity:    Worry: Not on file    Inability: Not on file  . Transportation needs:    Medical: Not on file    Non-medical: Not on file  Tobacco Use  . Smoking status: Former Smoker    Last attempt to quit: 05/14/2014    Years since quitting: 3.4  . Smokeless tobacco: Never Used  Substance and Sexual Activity  . Alcohol use: Yes    Alcohol/week: 4.2 oz    Types: 7 Standard drinks or equivalent per week    Comment: occasional  . Drug use: No  . Sexual activity: Yes  Lifestyle  . Physical activity:    Days per week:  Not on file    Minutes per session: Not on file  . Stress: Not on file  Relationships  . Social connections:    Talks on phone: Not on file    Gets together: Not on file    Attends religious service: Not on file    Active member of club or organization: Not on file    Attends meetings of clubs or organizations: Not on file    Relationship status: Not on file  . Intimate partner violence:    Fear of current or ex partner: Not on file    Emotionally abused: Not on file    Physically abused: Not on file    Forced sexual activity: Not on file  Other Topics Concern  . Not on file  Social History Narrative  . Not on file   Review of Systems  Constitutional: Negative for fever and weight loss.  HENT: Negative for ear discharge, ear pain, hearing loss and tinnitus.   Eyes: Negative for blurred vision, double vision, photophobia and pain.  Respiratory: Negative for cough and shortness of breath.   Cardiovascular: Negative for chest pain and palpitations.  Gastrointestinal: Negative for abdominal pain, blood in stool, constipation, diarrhea, heartburn, melena, nausea and vomiting.  Genitourinary: Negative for dysuria, flank pain, frequency, hematuria and urgency.  Musculoskeletal: Negative for falls.  Neurological: Negative for dizziness, loss of consciousness and headaches.  Endo/Heme/Allergies: Negative for environmental allergies.  Psychiatric/Behavioral: Negative for depression, hallucinations, substance abuse and suicidal ideas. The patient is not nervous/anxious and does not have insomnia.    BP 120/66   Pulse 72   Temp 97.8 F (36.6 C) (Oral)   Resp 16   Ht 6\' 1"  (1.854 m)   Wt 299 lb 9.6 oz (135.9 kg)   SpO2 97%   BMI 39.53 kg/m   Physical Exam  Constitutional: He is oriented to person, place, and time. He appears well-developed and well-nourished. No distress.  HENT:  Head: Normocephalic and atraumatic.  Right Ear: Tympanic membrane, external ear and ear canal normal.    Left Ear: Tympanic membrane, external ear and ear canal normal.  Nose: Nose normal.  Mouth/Throat: Oropharynx is clear and moist and mucous membranes are normal. No posterior oropharyngeal edema or posterior oropharyngeal erythema.  Eyes: Pupils are equal, round, and reactive to light. Conjunctivae are normal.  Neck: Neck supple. No thyromegaly present.  Cardiovascular: Normal rate, regular rhythm, normal heart sounds and intact distal pulses.  Pulmonary/Chest: Effort normal and breath sounds normal. No respiratory distress.  He has no wheezes. He has no rales. He exhibits no tenderness.  Abdominal: Soft. Bowel sounds are normal. He exhibits no distension and no mass. There is no tenderness. There is no rebound and no guarding.  Lymphadenopathy:    He has no cervical adenopathy.  Neurological: He is alert and oriented to person, place, and time. No cranial nerve deficit.  Skin: Skin is warm and dry. No rash noted. He is not diaphoretic.  Psychiatric: He has a normal mood and affect.  Vitals reviewed.  Diabetic Foot Exam - Simple   Simple Foot Form Diabetic Foot exam was performed with the following findings:  Yes 10/16/2017 10:54 AM  Visual Inspection No deformities, no ulcerations, no other skin breakdown bilaterally:  Yes Sensation Testing Intact to touch and monofilament testing bilaterally:  Yes Pulse Check Posterior Tibialis and Dorsalis pulse intact bilaterally:  Yes Comments    Assessment/Plan: Essential hypertension, benign BP normotensive. Continue current regimen. Repeat CMP today.  Diabetes mellitus due to underlying condition with unspecified complications ([place]) Foot exam updated. Eye exam and immunizations are up-to-date. Is on ACEI. Will check CMP, A1C and Lipids today. Will switch Metformin to XR preparation once results are in so that he can get in full dose daily. Check glucose as directed. Will alter regimen further based on results. Follow-up every 3 months until  A1C < 7.5 .  Gout, chronic Doing very well. Continue current regimen.   Need for hepatitis C screening test Order for Hep C antibody placed.   Prostate cancer screening The natural history of prostate cancer and ongoing controversy regarding screening and potential treatment outcomes of prostate cancer has been discussed with the patient. The meaning of a false positive PSA and a false negative PSA has been discussed. He indicates understanding of the limitations of this screening test and wishes to proceed with screening PSA testing.   Visit for preventive health examination Depression screen negative. Health Maintenance reviewed. Preventive schedule discussed and handout given in AVS. Will obtain fasting labs today.     Leeanne Rio, PA-C

## 2017-10-16 NOTE — Assessment & Plan Note (Signed)
The natural history of prostate cancer and ongoing controversy regarding screening and potential treatment outcomes of prostate cancer has been discussed with the patient. The meaning of a false positive PSA and a false negative PSA has been discussed. He indicates understanding of the limitations of this screening test and wishes  to proceed with screening PSA testing.  

## 2017-10-16 NOTE — Assessment & Plan Note (Signed)
BP normotensive. Continue current regimen. Repeat CMP today.

## 2017-10-16 NOTE — Patient Instructions (Signed)
Please go to the lab for blood work.   Our office will call you with your results unless you have chosen to receive results via MyChart.  If your blood work is normal we will follow-up each year for physicals and as scheduled for chronic medical problems.  If anything is abnormal we will treat accordingly and get you in for a follow-up.  Please continue current medications as directed.  We will assess A1C and alter the Metformin as indicated.    Preventive Care 40-64 Years, Male Preventive care refers to lifestyle choices and visits with your health care provider that can promote health and wellness. What does preventive care include?  A yearly physical exam. This is also called an annual well check.  Dental exams once or twice a year.  Routine eye exams. Ask your health care provider how often you should have your eyes checked.  Personal lifestyle choices, including: ? Daily care of your teeth and gums. ? Regular physical activity. ? Eating a healthy diet. ? Avoiding tobacco and drug use. ? Limiting alcohol use. ? Practicing safe sex. ? Taking low-dose aspirin every day starting at age 51. What happens during an annual well check? The services and screenings done by your health care provider during your annual well check will depend on your age, overall health, lifestyle risk factors, and family history of disease. Counseling Your health care provider may ask you questions about your:  Alcohol use.  Tobacco use.  Drug use.  Emotional well-being.  Home and relationship well-being.  Sexual activity.  Eating habits.  Work and work Statistician.  Screening You may have the following tests or measurements:  Height, weight, and BMI.  Blood pressure.  Lipid and cholesterol levels. These may be checked every 5 years, or more frequently if you are over 70 years old.  Skin check.  Lung cancer screening. You may have this screening every year starting at age 58 if  you have a 30-pack-year history of smoking and currently smoke or have quit within the past 15 years.  Fecal occult blood test (FOBT) of the stool. You may have this test every year starting at age 49.  Flexible sigmoidoscopy or colonoscopy. You may have a sigmoidoscopy every 5 years or a colonoscopy every 10 years starting at age 30.  Prostate cancer screening. Recommendations will vary depending on your family history and other risks.  Hepatitis C blood test.  Hepatitis B blood test.  Sexually transmitted disease (STD) testing.  Diabetes screening. This is done by checking your blood sugar (glucose) after you have not eaten for a while (fasting). You may have this done every 1-3 years.  Discuss your test results, treatment options, and if necessary, the need for more tests with your health care provider. Vaccines Your health care provider may recommend certain vaccines, such as:  Influenza vaccine. This is recommended every year.  Tetanus, diphtheria, and acellular pertussis (Tdap, Td) vaccine. You may need a Td booster every 10 years.  Varicella vaccine. You may need this if you have not been vaccinated.  Zoster vaccine. You may need this after age 28.  Measles, mumps, and rubella (MMR) vaccine. You may need at least one dose of MMR if you were born in 1957 or later. You may also need a second dose.  Pneumococcal 13-valent conjugate (PCV13) vaccine. You may need this if you have certain conditions and have not been vaccinated.  Pneumococcal polysaccharide (PPSV23) vaccine. You may need one or two doses if you  smoke cigarettes or if you have certain conditions.  Meningococcal vaccine. You may need this if you have certain conditions.  Hepatitis A vaccine. You may need this if you have certain conditions or if you travel or work in places where you may be exposed to hepatitis A.  Hepatitis B vaccine. You may need this if you have certain conditions or if you travel or work in  places where you may be exposed to hepatitis B.  Haemophilus influenzae type b (Hib) vaccine. You may need this if you have certain risk factors.  Talk to your health care provider about which screenings and vaccines you need and how often you need them. This information is not intended to replace advice given to you by your health care provider. Make sure you discuss any questions you have with your health care provider. Document Released: 06/04/2015 Document Revised: 01/26/2016 Document Reviewed: 03/09/2015 Elsevier Interactive Patient Education  [employer]. .

## 2017-10-16 NOTE — Assessment & Plan Note (Signed)
Foot exam updated. Eye exam and immunizations are up-to-date. Is on ACEI. Will check CMP, A1C and Lipids today. Will switch Metformin to XR preparation once results are in so that he can get in full dose daily. Check glucose as directed. Will alter regimen further based on results. Follow-up every 3 months until A1C < 7.5 .

## 2017-10-16 NOTE — Assessment & Plan Note (Signed)
Depression screen negative. Health Maintenance reviewed. Preventive schedule discussed and handout given in AVS. Will obtain fasting labs today.  

## 2017-10-17 ENCOUNTER — Other Ambulatory Visit: Payer: Self-pay | Admitting: *Deleted

## 2017-10-17 DIAGNOSIS — E118 Type 2 diabetes mellitus with unspecified complications: Secondary | ICD-10-CM

## 2017-10-17 LAB — HEPATITIS C ANTIBODY
HEP C AB: NONREACTIVE
SIGNAL TO CUT-OFF: 0.12 (ref <1.00)

## 2017-10-17 MED ORDER — METFORMIN HCL ER 500 MG PO TB24: 500.0000 mg | ORAL_TABLET | Freq: Three times a day (TID) | ORAL | 2 refills | Status: DC

## 2017-10-25 ENCOUNTER — Other Ambulatory Visit: Payer: Self-pay | Admitting: Physician Assistant

## 2017-10-28 IMAGING — NM NM MISC PROCEDURE
3 series · 18 of 18 positions shown · non-contrast
Comparison: none

[Series 1: wbr_s-proj_st stress_(id)_sa · 6.5mm · 6.51mm/px · 6 of 512 frames shown (1 of 2)]
[frame 43/512]
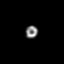
[frame 128/512]
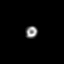
[frame 214/512]
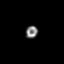
[frame 299/512]
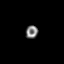
[frame 384/512]
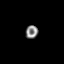
[frame 470/512]
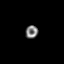

[Series 1: wbr_r-proj_st rest_(id)_sa · 6.5mm · 6.51mm/px · 6 of 64 frames shown]
[frame 6/64]
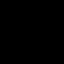
[frame 16/64]
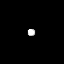
[frame 27/64]
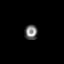
[frame 38/64]
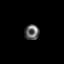
[frame 48/64]
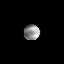
[frame 59/64]
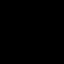

[Series 1: wbr_s-proj_st stress_(id)_sa · 6.5mm · 6.51mm/px · 6 of 64 frames shown (2 of 2)]
[frame 6/64]
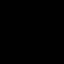
[frame 16/64]
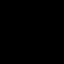
[frame 27/64]
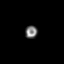
[frame 38/64]
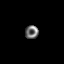
[frame 48/64]
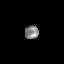
[frame 59/64]
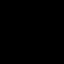

[18 of 18 positions shown; findings below may reference images not displayed]

Canned report from images found in remote index.

Refer to host system for actual result text.

## 2017-12-06 ENCOUNTER — Other Ambulatory Visit: Payer: Self-pay | Admitting: Physician Assistant

## 2018-01-12 ENCOUNTER — Other Ambulatory Visit: Payer: Self-pay | Admitting: Physician Assistant

## 2018-01-12 DIAGNOSIS — E118 Type 2 diabetes mellitus with unspecified complications: Secondary | ICD-10-CM

## 2018-01-29 ENCOUNTER — Ambulatory Visit: Payer: BLUE CROSS/BLUE SHIELD | Admitting: Physician Assistant

## 2018-01-29 ENCOUNTER — Encounter: Payer: Self-pay | Admitting: Physician Assistant

## 2018-01-29 ENCOUNTER — Other Ambulatory Visit: Payer: Self-pay

## 2018-01-29 VITALS — BP 112/81 | HR 81 | Temp 98.6°F | Resp 16 | Ht 73.0 in | Wt 294.1 lb

## 2018-01-29 DIAGNOSIS — R319 Hematuria, unspecified: Secondary | ICD-10-CM | POA: Diagnosis not present

## 2018-01-29 DIAGNOSIS — R109 Unspecified abdominal pain: Secondary | ICD-10-CM

## 2018-01-29 LAB — POCT URINALYSIS DIPSTICK
Bilirubin, UA: NEGATIVE
Glucose, UA: NEGATIVE
Ketones, UA: NEGATIVE
Leukocytes, UA: NEGATIVE
Nitrite, UA: NEGATIVE
Protein, UA: NEGATIVE
Spec Grav, UA: 1.02
Urobilinogen, UA: 0.2 U/dL
pH, UA: 5

## 2018-01-29 LAB — BASIC METABOLIC PANEL WITH GFR
BUN: 24 mg/dL — ABNORMAL HIGH (ref 6–23)
CO2: 29 meq/L (ref 19–32)
Calcium: 9.1 mg/dL (ref 8.4–10.5)
Chloride: 99 meq/L (ref 96–112)
Creatinine, Ser: 1.41 mg/dL (ref 0.40–1.50)
GFR: 55.62 mL/min — ABNORMAL LOW
Glucose, Bld: 143 mg/dL — ABNORMAL HIGH (ref 70–99)
Potassium: 4 meq/L (ref 3.5–5.1)
Sodium: 138 meq/L (ref 135–145)

## 2018-01-29 MED ORDER — TRAMADOL HCL 50 MG PO TABS: 50.0000 mg | ORAL_TABLET | Freq: Three times a day (TID) | ORAL | 0 refills | Status: DC | PRN

## 2018-01-29 NOTE — Patient Instructions (Signed)
Please go to the lab today for blood work.  Then stop by the front desk to speak with Levada Dy regarding your CT scan.  I will call you with your results. We will alter treatment regimen(s) if indicated by your results.   I have given you a prescription for Tramadol for more severe pain.  Can take Ibuprofen as well. A heating pad may also be beneficial.   If anything acutely worsens before assessment is complete, please go to the ER.

## 2018-01-29 NOTE — Progress Notes (Signed)
Patient presents to clinic today c/o 1 week of intermittent L-sided back/flank pain described as dull mostly with occasional stabbing pain. Symptoms are colicky in nature. Has noted some darker urine and questionable hematuria. Cannot get comfortable. Notes occasional urinary hesitancy without dysuria or urgency. Pain when present is about a 9/10.  Denies fever or vomiting but notes occasional vomiting. Has taken ibuprofen with some mild improvement in symptoms.   Past Medical History:  Diagnosis Date  . Chicken pox   . Gout   . HTN (hypertension)   . Hx of diabetes mellitus    Resolved  . Skin lesion    Benign    Current Outpatient Medications on File Prior to Visit  Medication Sig Dispense Refill  . allopurinol (ZYLOPRIM) 100 MG tablet TAKE 1 TABLET BY MOUTH EVERY DAY 90 tablet 1  . aspirin 81 MG tablet Take 81 mg by mouth daily.    Marland Kitchen atorvastatin (LIPITOR) 10 MG tablet Take 1 tablet (10 mg total) by mouth daily. 90 tablet 3  . metFORMIN (GLUCOPHAGE-XR) 500 MG 24 hr tablet TAKE 1 TABLET BY MOUTH THREE TIMES A DAY 270 tablet 0  . olmesartan-hydrochlorothiazide (BENICAR HCT) 40-25 MG tablet TAKE 1 TABLET BY MOUTH EVERY DAY 30 tablet 3   No current facility-administered medications on file prior to visit.     Allergies  Allergen Reactions  . Penicillins Anaphylaxis    Family History  Problem Relation Age of Onset  . Heart disease Father 9       Deceased  . Heart attack Father   . Hypertension Father   . Diabetes Father   . Arthritis-Osteo Mother        Living  . Colon cancer Mother   . Stroke Mother   . Hypertension Mother   . Breast cancer Mother   . Asthma Mother   . Heart disease Paternal Grandfather   . Heart attack Paternal Grandfather   . Heart attack Maternal Grandmother   . Healthy Sister   . Healthy Brother   . Asthma Son        x2  . Asthma Maternal Grandfather    Social History   Socioeconomic History  . Marital status: Divorced    Spouse name:  Not on file  . Number of children: Not on file  . Years of education: Not on file  . Highest education level: Not on file  Occupational History  . Not on file  Social Needs  . Financial resource strain: Not on file  . Food insecurity:    Worry: Not on file    Inability: Not on file  . Transportation needs:    Medical: Not on file    Non-medical: Not on file  Tobacco Use  . Smoking status: Former Smoker    Last attempt to quit: 05/14/2014    Years since quitting: 3.7  . Smokeless tobacco: Never Used  Substance and Sexual Activity  . Alcohol use: Yes    Alcohol/week: 7.0 standard drinks    Types: 7 Standard drinks or equivalent per week    Comment: occasional  . Drug use: No  . Sexual activity: Yes  Lifestyle  . Physical activity:    Days per week: Not on file    Minutes per session: Not on file  . Stress: Not on file  Relationships  . Social connections:    Talks on phone: Not on file    Gets together: Not on file    Attends religious service:  Not on file    Active member of club or organization: Not on file    Attends meetings of clubs or organizations: Not on file    Relationship status: Not on file  Other Topics Concern  . Not on file  Social History Narrative  . Not on file   Review of Systems - See HPI.  All other ROS are negative.  BP 112/81   Pulse 81   Temp 98.6 F (37 C) (Oral)   Resp 16   Ht 6\' 1"  (1.854 m)   Wt 294 lb 2 oz (133.4 kg)   SpO2 97%   BMI 38.81 kg/m   Physical Exam  Constitutional: He appears well-developed and well-nourished.  HENT:  Head: Normocephalic and atraumatic.  Eyes: Conjunctivae are normal.  Cardiovascular: Normal rate, regular rhythm, normal heart sounds and intact distal pulses.  Pulmonary/Chest: Effort normal and breath sounds normal.  Abdominal: Soft. Bowel sounds are normal. He exhibits no distension and no mass. There is no tenderness. There is no rebound and no guarding. No hernia.  Negative CVA tenderness.     Vitals reviewed.  Assessment/Plan: 1. Flank pain 2. Hematuria, unspecified type Urine dip with blood and elevated SG. No sign of infection. Classic nephrolithiasis symptoms of colicky flank pain, hematuria and nausea. Will check BMP today and obtain STAT CT renal stone study. Tramadol for moderate to severe pain. ER if anything acutely worsens before workup is complete.  - Basic metabolic panel - CT RENAL STONE STUDY; Future   Leeanne Rio, PA-C

## 2018-01-30 ENCOUNTER — Ambulatory Visit
Admission: RE | Admit: 2018-01-30 | Discharge: 2018-01-30 | Disposition: A | Discharge status: Home / Self Care | Payer: BLUE CROSS/BLUE SHIELD | Source: Ambulatory Visit | Attending: Physician Assistant | Admitting: Physician Assistant

## 2018-01-30 ENCOUNTER — Other Ambulatory Visit: Payer: Self-pay | Admitting: General Practice

## 2018-01-30 DIAGNOSIS — R319 Hematuria, unspecified: Secondary | ICD-10-CM

## 2018-01-30 DIAGNOSIS — K8020 Calculus of gallbladder without cholecystitis without obstruction: Secondary | ICD-10-CM | POA: Diagnosis not present

## 2018-01-30 DIAGNOSIS — N200 Calculus of kidney: Secondary | ICD-10-CM

## 2018-01-30 DIAGNOSIS — R109 Unspecified abdominal pain: Secondary | ICD-10-CM

## 2018-01-30 MED ORDER — TAMSULOSIN HCL 0.4 MG PO CAPS: 0.4000 mg | ORAL_CAPSULE | Freq: Every day | ORAL | 3 refills | Status: DC

## 2018-01-30 NOTE — Addendum Note (Signed)
Addended by: Desmond Dike L on: 01/30/2018 10:06 AM   Modules accepted: Orders

## 2018-02-18 ENCOUNTER — Other Ambulatory Visit: Payer: Self-pay | Admitting: Physician Assistant

## 2018-03-13 ENCOUNTER — Other Ambulatory Visit: Payer: Self-pay

## 2018-03-13 MED ORDER — ATORVASTATIN CALCIUM 10 MG PO TABS: 10.0000 mg | ORAL_TABLET | Freq: Every day | ORAL | 0 refills | Status: DC

## 2018-04-22 ENCOUNTER — Other Ambulatory Visit: Payer: Self-pay | Admitting: Physician Assistant

## 2018-05-03 ENCOUNTER — Other Ambulatory Visit: Payer: Self-pay | Admitting: Physician Assistant

## 2018-05-04 ENCOUNTER — Other Ambulatory Visit: Payer: Self-pay | Admitting: Physician Assistant

## 2018-05-06 ENCOUNTER — Other Ambulatory Visit: Payer: Self-pay

## 2018-06-07 ENCOUNTER — Other Ambulatory Visit: Payer: Self-pay

## 2018-06-07 ENCOUNTER — Encounter: Payer: Self-pay | Admitting: Physician Assistant

## 2018-06-07 ENCOUNTER — Ambulatory Visit: Payer: BLUE CROSS/BLUE SHIELD | Admitting: Physician Assistant

## 2018-06-07 VITALS — BP 132/86 | HR 67 | Temp 97.7°F | Resp 16 | Ht 73.0 in | Wt 294.0 lb

## 2018-06-07 DIAGNOSIS — M1A9XX0 Chronic gout, unspecified, without tophus (tophi): Secondary | ICD-10-CM | POA: Diagnosis not present

## 2018-06-07 DIAGNOSIS — I10 Essential (primary) hypertension: Secondary | ICD-10-CM | POA: Diagnosis not present

## 2018-06-07 DIAGNOSIS — E088 Diabetes mellitus due to underlying condition with unspecified complications: Secondary | ICD-10-CM

## 2018-06-07 DIAGNOSIS — Z720 Tobacco use: Secondary | ICD-10-CM

## 2018-06-07 DIAGNOSIS — E782 Mixed hyperlipidemia: Secondary | ICD-10-CM

## 2018-06-07 DIAGNOSIS — E118 Type 2 diabetes mellitus with unspecified complications: Secondary | ICD-10-CM

## 2018-06-07 DIAGNOSIS — Z1211 Encounter for screening for malignant neoplasm of colon: Secondary | ICD-10-CM

## 2018-06-07 LAB — COMPREHENSIVE METABOLIC PANEL
ALT: 31 U/L (ref 0–53)
AST: 32 U/L (ref 0–37)
Albumin: 4.3 g/dL (ref 3.5–5.2)
Alkaline Phosphatase: 64 U/L (ref 39–117)
BUN: 23 mg/dL (ref 6–23)
CALCIUM: 9.8 mg/dL (ref 8.4–10.5)
CO2: 29 meq/L (ref 19–32)
CREATININE: 0.9 mg/dL (ref 0.40–1.50)
Chloride: 103 mEq/L (ref 96–112)
GFR: 87.74 mL/min (ref >60.00)
Glucose, Bld: 109 mg/dL — ABNORMAL HIGH (ref 70–99)
Potassium: 4.6 mEq/L (ref 3.5–5.1)
Sodium: 141 mEq/L (ref 135–145)
Total Bilirubin: 0.7 mg/dL (ref 0.2–1.2)
Total Protein: 7.3 g/dL (ref 6.0–8.3)

## 2018-06-07 LAB — MICROALBUMIN / CREATININE URINE RATIO
Creatinine,U: 164.8 mg/dL
Microalb Creat Ratio: 0.4 mg/g (ref 0.0–30.0)

## 2018-06-07 LAB — LIPID PANEL
CHOL/HDL RATIO: 4
Cholesterol: 142 mg/dL (ref 0–200)
HDL: 34.7 mg/dL — AB (ref >39.00)
LDL CALC: 78 mg/dL (ref 0–99)
NonHDL: 106.82
TRIGLYCERIDES: 143 mg/dL (ref 0.0–149.0)
VLDL: 28.6 mg/dL (ref 0.0–40.0)

## 2018-06-07 LAB — URIC ACID: Uric Acid, Serum: 9.9 mg/dL — ABNORMAL HIGH (ref 4.0–7.8)

## 2018-06-07 LAB — HEMOGLOBIN A1C: Hgb A1c MFr Bld: 7.2 % — ABNORMAL HIGH (ref 4.6–6.5)

## 2018-06-07 NOTE — Progress Notes (Addendum)
History of Present Illness: Patient is a 53 y.o. male who presents to clinic today for follow-up of Diabetes Mellitus II, previously uncontrolled without complication.  Patient currently on medication regimen of Metformin XR 1500 once daily.  Endorses taking medications as directed. Endorses switching to a plant-based diet. Has lost 5 pounds thus far. Goal is to lose 10 more pounds right now and then start working in exercise. Notes he does better and sticks with a regimen if making one change at a time.  Denies vision changes, numbness, tingling or swelling of extremities.. Is checking blood glucose as directed. Averaging 100-115.   Latest Maintenance: A1C --  Lab Results  Component Value Date   HGBA1C 8.2 (H) 10/16/2017   Diabetic Eye Exam -- Overdue. Patient is scheduling..  Urine Microalbumin -- Due. Foot Exam -- up-to-date. No concerns.   Is working on smoking cessation. Has stopped before on his own for 13 years and feel he can do it again. Does not want any      intervention or aid on our part presently.   Past Medical History:  Diagnosis Date  . Chicken pox   . Gout   . HTN (hypertension)   . Hx of diabetes mellitus    Resolved  . Skin lesion    Benign    Current Outpatient Medications on File Prior to Visit  Medication Sig Dispense Refill  . allopurinol (ZYLOPRIM) 100 MG tablet TAKE 1 TABLET BY MOUTH EVERY DAY 90 tablet 0  . aspirin 81 MG tablet Take 81 mg by mouth daily.    Marland Kitchen atorvastatin (LIPITOR) 10 MG tablet Take 1 tablet (10 mg total) by mouth daily. 30 tablet 0  . metFORMIN (GLUCOPHAGE-XR) 500 MG 24 hr tablet TAKE 1 TABLET BY MOUTH THREE TIMES A DAY 270 tablet 0  . olmesartan-hydrochlorothiazide (BENICAR HCT) 40-25 MG tablet Take 1 tablet by mouth daily. Please call the office to schedule follow up appointment for more refills 30 tablet 0   No current facility-administered medications on file prior to visit.     Allergies  Allergen Reactions  . Penicillins  Anaphylaxis    Family History  Problem Relation Age of Onset  . Heart disease Father 45       Deceased  . Heart attack Father   . Hypertension Father   . Diabetes Father   . Arthritis-Osteo Mother        Living  . Colon cancer Mother   . Stroke Mother   . Hypertension Mother   . Breast cancer Mother   . Asthma Mother   . Heart disease Paternal Grandfather   . Heart attack Paternal Grandfather   . Heart attack Maternal Grandmother   . Healthy Sister   . Healthy Brother   . Asthma Son        x2  . Asthma Maternal Grandfather     Social History   Socioeconomic History  . Marital status: Divorced    Spouse name: Not on file  . Number of children: Not on file  . Years of education: Not on file  . Highest education level: Not on file  Occupational History  . Not on file  Social Needs  . Financial resource strain: Not on file  . Food insecurity:    Worry: Not on file    Inability: Not on file  . Transportation needs:    Medical: Not on file    Non-medical: Not on file  Tobacco Use  . Smoking status:  Former Smoker    Last attempt to quit: 05/14/2014    Years since quitting: 4.0  . Smokeless tobacco: Never Used  Substance and Sexual Activity  . Alcohol use: Yes    Alcohol/week: 7.0 standard drinks    Types: 7 Standard drinks or equivalent per week    Comment: occasional  . Drug use: No  . Sexual activity: Yes  Lifestyle  . Physical activity:    Days per week: Not on file    Minutes per session: Not on file  . Stress: Not on file  Relationships  . Social connections:    Talks on phone: Not on file    Gets together: Not on file    Attends religious service: Not on file    Active member of club or organization: Not on file    Attends meetings of clubs or organizations: Not on file    Relationship status: Not on file  Other Topics Concern  . Not on file  Social History Narrative  . Not on file   Review of Systems: Pertinent ROS are listed in  HPI  Physical Examination: BP 132/86   Pulse 67   Temp 97.7 F (36.5 C) (Oral)   Resp 16   Ht 6\' 1"  (1.854 m)   Wt 294 lb (133.4 kg)   SpO2 99%   BMI 38.79 kg/m  General appearance: alert, cooperative, appears stated age and no distress Head: Normocephalic, without obvious abnormality, atraumatic Ears: normal TM's and external ear canals both ears Nose: Nares normal. Septum midline. Mucosa normal. No drainage or sinus tenderness. Throat: lips, mucosa, and tongue normal; teeth and gums normal Lungs: clear to auscultation bilaterally Heart: regular rate and rhythm, S1, S2 normal, no murmur, click, rub or gallop Abdomen: soft, non-tender; bowel sounds normal; no masses,  no organomegaly Extremities: extremities normal, atraumatic, no cyanosis or edema Pulses: 2+ and symmetric  Assessment/Plan: 1. Diabetes melliltus with unspecified complications ([place]) Fasting glucose much improved. Weight loss from dietary changes noted. Eye exam to be scheduled. Foot exam up-to-date. Immunizations up-to-date. Repeat labs today to also include urine microalbumin. Will alter regimen accordingly.  - Comprehensive metabolic panel - Lipid panel - Hemoglobin A1c - Urine Microalbumin w/creat. ratio  2. Essential hypertension, benign Stable. Asymptomatic. Repeat lipid panel. - Comprehensive metabolic panel  3. Chronic gout without tophus, unspecified cause, unspecified site No flares. Dietary changes and weight loss since last visit. Repeat uric acid level. Allopurinol refilled. - Uric acid  4. Mixed dyslipidemia - Lipid panel  5. Tobacco use Wanting to quit on his own. Will monitor.   6. Colon cancer screening - Ambulatory referral to Gastroenterology for screening colonoscopy.

## 2018-06-07 NOTE — Patient Instructions (Signed)
Please go to the lab today for blood work.  I will call you with your results. We will alter treatment regimen(s) if indicated by your results.   Keep up with diet and exercise regimen! I am very impressed with the changes you are making.  Let us know if you want [place] to help with smoking cessation Keep [place] updated.  You will be contacted to schedule a screening colonoscopy. Also remember to schedule your diabetic eye exam!  Follow-up will be based on lab results.    Diabetes Mellitus and Exercise Exercising regularly is important for your overall health, especially when you have diabetes (diabetes mellitus). Exercising is not only about losing weight. It has many other health benefits, such as increasing muscle strength and bone density and reducing body fat and stress. This leads to improved fitness, flexibility, and endurance, all of which result in better overall health. Exercise has additional benefits for people with diabetes, including:  Reducing appetite.  Helping to lower and control blood glucose.  Lowering blood pressure.  Helping to control amounts of fatty substances (lipids) in the blood, such as cholesterol and triglycerides.  Helping the body to respond better to insulin (improving insulin sensitivity).  Reducing how much insulin the body needs.  Decreasing the risk for heart disease by: ? Lowering cholesterol and triglyceride levels. ? Increasing the levels of good cholesterol. ? Lowering blood glucose levels. What is my activity plan? Your health care provider or certified diabetes educator can help you make a plan for the type and frequency of exercise (activity plan) that works for you. Make sure that you:  Do at least 150 minutes of moderate-intensity or vigorous-intensity exercise each week. This could be brisk walking, biking, or water aerobics. ? Do stretching and strength exercises, such as yoga or weightlifting, at least 2 times a week. ? Spread out  your activity over at least 3 days of the week.  Get some form of physical activity every day. ? Do not go more than 2 days in a row without some kind of physical activity. ? Avoid being inactive for more than 30 minutes at a time. Take frequent breaks to walk or stretch.  Choose a type of exercise or activity that you enjoy, and set realistic goals.  Start slowly, and gradually increase the intensity of your exercise over time. What do I need to know about managing my diabetes?   Check your blood glucose before and after exercising. ? If your blood glucose is 240 mg/dL (13.3 mmol/L) or higher before you exercise, check your urine for ketones. If you have ketones in your urine, do not exercise until your blood glucose returns to normal. ? If your blood glucose is 100 mg/dL (5.6 mmol/L) or lower, eat a snack containing 15-20 grams of carbohydrate. Check your blood glucose 15 minutes after the snack to make sure that your level is above 100 mg/dL (5.6 mmol/L) before you start your exercise.  Know the symptoms of low blood glucose (hypoglycemia) and how to treat it. Your risk for hypoglycemia increases during and after exercise. Common symptoms of hypoglycemia can include: ? Hunger. ? Anxiety. ? Sweating and feeling clammy. ? Confusion. ? Dizziness or feeling light-headed. ? Increased heart rate or palpitations. ? Blurry vision. ? Tingling or numbness around the mouth, lips, or tongue. ? Tremors or shakes. ? Irritability.  Keep a rapid-acting carbohydrate snack available before, during, and after exercise to help prevent or treat hypoglycemia.  Avoid injecting insulin into areas of  the body that are going to be exercised. For example, avoid injecting insulin into: ? The arms, when playing tennis. ? The legs, when jogging.  Keep records of your exercise habits. Doing this can help you and your health care provider adjust your diabetes management plan as needed. Write down: ? Food that  you eat before and after you exercise. ? Blood glucose levels before and after you exercise. ? The type and amount of exercise you have done. ? When your insulin is expected to peak, if you use insulin. Avoid exercising at times when your insulin is peaking.  When you start a new exercise or activity, work with your health care provider to make sure the activity is safe for you, and to adjust your insulin, medicines, or food intake as needed.  Drink plenty of water while you exercise to prevent dehydration or heat stroke. Drink enough fluid to keep your urine clear or pale yellow. Summary  Exercising regularly is important for your overall health, especially when you have diabetes (diabetes mellitus).  Exercising has many health benefits, such as increasing muscle strength and bone density and reducing body fat and stress.  Your health care provider or certified diabetes educator can help you make a plan for the type and frequency of exercise (activity plan) that works for you.  When you start a new exercise or activity, work with your health care provider to make sure the activity is safe for you, and to adjust your insulin, medicines, or food intake as needed. This information is not intended to replace advice given to you by your health care provider. Make sure you discuss any questions you have with your health care provider. Document Released: 07/29/2003 Document Revised: 11/16/2016 Document Reviewed: 10/18/2015 Elsevier Interactive Patient Education  2019 [employer].

## 2018-06-09 ENCOUNTER — Other Ambulatory Visit: Payer: Self-pay | Admitting: Physician Assistant

## 2018-06-09 MED ORDER — ALLOPURINOL 100 MG PO TABS: 100.0000 mg | ORAL_TABLET | Freq: Every day | ORAL | 1 refills | Status: DC

## 2018-06-10 ENCOUNTER — Other Ambulatory Visit: Payer: Self-pay | Admitting: Physician Assistant

## 2018-06-10 DIAGNOSIS — M1A9XX0 Chronic gout, unspecified, without tophus (tophi): Secondary | ICD-10-CM

## 2018-06-10 DIAGNOSIS — E118 Type 2 diabetes mellitus with unspecified complications: Secondary | ICD-10-CM

## 2018-06-10 MED ORDER — ALLOPURINOL 100 MG PO TABS: 100.0000 mg | ORAL_TABLET | Freq: Every day | ORAL | 1 refills | Status: DC

## 2018-06-19 ENCOUNTER — Other Ambulatory Visit: Payer: Self-pay | Admitting: Physician Assistant

## 2018-07-16 ENCOUNTER — Encounter: Payer: Self-pay | Admitting: Physician Assistant

## 2018-08-28 ENCOUNTER — Encounter: Payer: Self-pay | Admitting: Physician Assistant

## 2018-08-28 ENCOUNTER — Other Ambulatory Visit: Payer: Self-pay

## 2018-08-28 ENCOUNTER — Ambulatory Visit (INDEPENDENT_AMBULATORY_CARE_PROVIDER_SITE_OTHER): Payer: BLUE CROSS/BLUE SHIELD | Admitting: Physician Assistant

## 2018-08-28 DIAGNOSIS — M1A9XX0 Chronic gout, unspecified, without tophus (tophi): Secondary | ICD-10-CM

## 2018-08-28 DIAGNOSIS — I10 Essential (primary) hypertension: Secondary | ICD-10-CM

## 2018-08-28 DIAGNOSIS — E119 Type 2 diabetes mellitus without complications: Secondary | ICD-10-CM | POA: Diagnosis not present

## 2018-08-28 NOTE — Progress Notes (Addendum)
Virtual Visit via Video Note\ I connected with Louis Davis on 08/28/18 at  3:00 PM EDT by a video enabled telemedicine application and verified that I am speaking with the correct person using two identifiers.   I discussed the limitations of evaluation and management by telemedicine and the availability of in person appointments. The patient expressed understanding and agreed to proceed.  History of Present Illness: Patient presents via Doxy.me for follow-up of chronic medical issues.  Hypertension -- Patient currently on a regimen of Benicar-HCT. Endorses taking daily as directed. Has history of gout but well-controlled overall. Has had issue with other BP medicine regimens before and this works best for him. Patient denies chest pain, palpitations, lightheadedness, dizziness, vision changes or frequent headaches.  BP Readings from Last 3 Encounters:  06/07/18 132/86  01/29/18 112/81  10/16/17 120/66   DM -- Without complication.  Is due for eye examination. Foot exam up-to-date. Up-to-date on immunizations. Is on a low-carb, plant based diets overall. Is now minimizing carbohydrates.. Is walking at work but otherwise no current exercise regimen. Is checking glucose every other day with fasting glucose averaging 100-125. Is taking Metformin XR 1500 mg daily.   Gout -- Currently on Allopurinol 100 mg daily. Is taking and tolerating well without issue. Denies flare up of gout with medication..   Observations/Objective: Patient is well-developed, well-nourished in no acute distress.  Resting comfortably at home.  Head is normocephalic, atraumatic.  No labored breathing.  Speech is clear and coherent with logical contest.  Patient is alert and oriented at baseline.   Assessment and Plan: 1. Essential hypertension, benign Labs ordered. BP check tomorrow at lab appt. - Comp Met (CMET); Future   2. Type 2 diabetes mellitus without complication, without long-term current use of  insulin (HCC) Referral placed for diabetic eye examination. Dietary and exercise recommendations reviewed with patient. Labs ordered to include A1C and CMP. He will come in tomorrow for these. Will alter regimen accordingly.  - Ambulatory referral to Ophthalmology  3. Chronic gout without tophus, unspecified cause, unspecified site Doing well. Continue current regimen. Repeat uric acid level. - Uric acid; Future        Follow Up Instructions: Patient coming in tomorrow to tent for labs.   I discussed the assessment and treatment plan with the patient. The patient was provided an opportunity to ask questions and all were answered. The patient agreed with the plan and demonstrated an understanding of the instructions.   The patient was advised to call back or seek an in-person evaluation if the symptoms worsen or if the condition fails to improve as anticipated.   William Cody Martin, PA-C   

## 2018-08-28 NOTE — Patient Instructions (Signed)
Instructions sent to MyChart.   Continue medications as directed. Try to work on the [employer] again. I know you can get back on track! Try to get in 10,000 steps 5 days a week. Would love for you to also add in 30 minutes here and there of cardio/resistance exercises.  Limit alcohol and keep avoiding trigger foods for your gout.  We will see you tomorrow at the tent for lab draw and BP check.

## 2018-08-28 NOTE — Progress Notes (Signed)
I have discussed the procedure for the virtual visit with the patient who has given consent to proceed with assessment and treatment.   Louis Davis S Makita Blow, CMA     

## 2018-08-29 ENCOUNTER — Other Ambulatory Visit (INDEPENDENT_AMBULATORY_CARE_PROVIDER_SITE_OTHER): Payer: BLUE CROSS/BLUE SHIELD

## 2018-08-29 DIAGNOSIS — E119 Type 2 diabetes mellitus without complications: Secondary | ICD-10-CM | POA: Diagnosis not present

## 2018-08-29 DIAGNOSIS — I10 Essential (primary) hypertension: Secondary | ICD-10-CM | POA: Diagnosis not present

## 2018-08-29 DIAGNOSIS — M1A9XX0 Chronic gout, unspecified, without tophus (tophi): Secondary | ICD-10-CM | POA: Diagnosis not present

## 2018-08-29 DIAGNOSIS — E088 Diabetes mellitus due to underlying condition with unspecified complications: Secondary | ICD-10-CM

## 2018-08-29 LAB — COMPREHENSIVE METABOLIC PANEL
ALT: 43 U/L (ref 0–53)
AST: 35 U/L (ref 0–37)
Albumin: 4.1 g/dL (ref 3.5–5.2)
Alkaline Phosphatase: 75 U/L (ref 39–117)
BUN: 18 mg/dL (ref 6–23)
CO2: 28 mEq/L (ref 19–32)
Calcium: 9.6 mg/dL (ref 8.4–10.5)
Chloride: 102 mEq/L (ref 96–112)
Creatinine, Ser: 0.93 mg/dL (ref 0.40–1.50)
GFR: 84.41 mL/min (ref >60.00)
Glucose, Bld: 148 mg/dL — ABNORMAL HIGH (ref 70–99)
Potassium: 4.4 mEq/L (ref 3.5–5.1)
Sodium: 140 mEq/L (ref 135–145)
Total Bilirubin: 1.1 mg/dL (ref 0.2–1.2)
Total Protein: 7.1 g/dL (ref 6.0–8.3)

## 2018-08-29 LAB — URIC ACID: Uric Acid, Serum: 7.8 mg/dL (ref 4.0–7.8)

## 2018-08-29 LAB — HEMOGLOBIN A1C: Hgb A1c MFr Bld: 7.3 % — ABNORMAL HIGH (ref 4.6–6.5)

## 2018-09-02 ENCOUNTER — Encounter: Payer: Self-pay | Admitting: Physician Assistant

## 2018-09-07 DIAGNOSIS — E119 Type 2 diabetes mellitus without complications: Secondary | ICD-10-CM | POA: Diagnosis not present

## 2018-09-07 LAB — HM DIABETES EYE EXAM

## 2018-09-09 ENCOUNTER — Encounter: Payer: Self-pay | Admitting: Emergency Medicine

## 2018-12-28 ENCOUNTER — Other Ambulatory Visit: Payer: Self-pay | Admitting: Physician Assistant

## 2019-02-02 ENCOUNTER — Other Ambulatory Visit: Payer: Self-pay | Admitting: Physician Assistant

## 2019-02-02 DIAGNOSIS — E118 Type 2 diabetes mellitus with unspecified complications: Secondary | ICD-10-CM

## 2019-05-08 ENCOUNTER — Other Ambulatory Visit: Payer: Self-pay | Admitting: Physician Assistant

## 2019-05-08 DIAGNOSIS — E118 Type 2 diabetes mellitus with unspecified complications: Secondary | ICD-10-CM

## 2019-05-08 NOTE — Telephone Encounter (Signed)
Patient is due for a DM follow up. LOV 08/2018

## 2019-05-09 ENCOUNTER — Other Ambulatory Visit: Payer: Self-pay | Admitting: Physician Assistant

## 2019-05-09 DIAGNOSIS — E118 Type 2 diabetes mellitus with unspecified complications: Secondary | ICD-10-CM

## 2019-06-04 ENCOUNTER — Other Ambulatory Visit: Payer: Self-pay | Admitting: Physician Assistant

## 2019-06-04 ENCOUNTER — Encounter: Payer: Self-pay | Admitting: Physician Assistant

## 2019-06-04 ENCOUNTER — Other Ambulatory Visit: Payer: Self-pay

## 2019-06-04 ENCOUNTER — Ambulatory Visit (INDEPENDENT_AMBULATORY_CARE_PROVIDER_SITE_OTHER): Payer: BC Managed Care – PPO | Admitting: Physician Assistant

## 2019-06-04 DIAGNOSIS — E782 Mixed hyperlipidemia: Secondary | ICD-10-CM | POA: Diagnosis not present

## 2019-06-04 DIAGNOSIS — M1A9XX0 Chronic gout, unspecified, without tophus (tophi): Secondary | ICD-10-CM

## 2019-06-04 DIAGNOSIS — E118 Type 2 diabetes mellitus with unspecified complications: Secondary | ICD-10-CM | POA: Diagnosis not present

## 2019-06-04 DIAGNOSIS — I10 Essential (primary) hypertension: Secondary | ICD-10-CM

## 2019-06-04 DIAGNOSIS — R609 Edema, unspecified: Secondary | ICD-10-CM

## 2019-06-04 DIAGNOSIS — Z1211 Encounter for screening for malignant neoplasm of colon: Secondary | ICD-10-CM

## 2019-06-04 DIAGNOSIS — R600 Localized edema: Secondary | ICD-10-CM

## 2019-06-04 MED ORDER — OLMESARTAN MEDOXOMIL-HCTZ 40-25 MG PO TABS: 1.0000 | ORAL_TABLET | Freq: Every day | ORAL | 1 refills | Status: DC

## 2019-06-04 MED ORDER — FUROSEMIDE 20 MG PO TABS: 20.0000 mg | ORAL_TABLET | Freq: Every day | ORAL | 0 refills | Status: DC

## 2019-06-04 MED ORDER — ALLOPURINOL 100 MG PO TABS: 100.0000 mg | ORAL_TABLET | Freq: Every day | ORAL | 1 refills | Status: DC

## 2019-06-04 NOTE — Addendum Note (Signed)
Addended by: Brunetta Jeans on: 06/04/2019 02:07 PM   Modules accepted: Orders

## 2019-06-04 NOTE — Progress Notes (Addendum)
Virtual Visit via Video   I connected with patient on 06/04/19 at  1:30 PM EST by a video enabled telemedicine application and verified that I am speaking with the correct person using two identifiers.  Location patient: Home Location provider: [facility], Office Persons participating in the virtual visit: Patient, Provider, [place] Denita Lung)  I discussed the limitations of evaluation and management by telemedicine and the availability of in person appointments. The patient expressed understanding and agreed to proceed.  Subjective:   HPI:   Patient presents via doxy.need today for follow-up of diabetes.  Patient is quite overdue for diabetic follow-up with last office visit in April 2020.  Patient unable to work Nurse, mental health.  As such was transition to phone visit.  In regards to diabetes, patient is currently on a regimen of metformin XR 1500 mg daily.  Patient endorses taking medication as directed and tolerating well.  Does check fasting blood glucose and notes it averages around 140.  Patient does endorse taking his Benicar HCT as well.  Checks blood pressure occasionally at home and noting to be normotensive. Patient denies chest pain, palpitations, lightheadedness, dizziness, vision changes or frequent headaches.  Patient denies polyuria, polydipsia or polyphagia.  Patient notes that diet has been very poor over the past several months, especially with Covid.  Was previously on a vegan diet but has not followed in quite some time.  Patient endorses late night eating.  Also endorses a very sedentary lifestyle.  Is currently working 12 to 16 hours a day, 5 days/week.  Sensitive desk during this time.  Notes a lot of times he falls asleep in his chair at home.  As such has noticed some swelling in feet and ankles bilaterally.  Patient declines flu vaccine.  Is overdue for foot exam but will need to be done at next in office appointment.  Patient also history with gout,  currently on a regimen of allopurinol 100 mg daily.  Has been on this regimen for quite some time.  No gout flare in greater than 1 year.  ROS:   See pertinent positives and negatives per HPI.  Patient Active Problem List   Diagnosis Date Noted  . Need for hepatitis C screening test 10/16/2017  . Diabetes mellitus due to underlying condition with unspecified complications ([place]) [id number]  . Mixed dyslipidemia 02/05/2017  . Plantar fasciitis of left foot 11/21/2016  . Family history of early CAD 07/19/2016  . Colon cancer screening 07/19/2016  . Prostate cancer screening 07/19/2016  . Gout, chronic 08/30/2015  . Visit for preventive health examination 09/12/2013  . Essential hypertension, benign 08/27/2013    Social History   Tobacco Use  . Smoking status: Former Smoker    Quit date: 05/14/2014    Years since quitting: 5.0  . Smokeless tobacco: Never Used  Substance Use Topics  . Alcohol use: Yes    Alcohol/week: 7.0 standard drinks    Types: 7 Standard drinks or equivalent per week    Comment: occasional    Current Outpatient Medications:  .  allopurinol (ZYLOPRIM) 100 MG tablet, Take 1 tablet (100 mg total) by mouth daily., Disp: 90 tablet, Rfl: 1 .  aspirin 81 MG tablet, Take 81 mg by mouth daily., Disp: , Rfl:  .  metFORMIN (GLUCOPHAGE-XR) 500 MG 24 hr tablet, Take 1 tablet (500 mg total) by mouth 3 (three) times daily. PLEASE CALL THE OFFICE TO DIABETES FOLLOW UP, Disp: 90 tablet, Rfl: 0 .  olmesartan-hydrochlorothiazide (BENICAR HCT) 40-25  MG tablet, TAKE 1 TABLET BY MOUTH EVERY DAY, Disp: 90 tablet, Rfl: 1  Allergies  Allergen Reactions  . Penicillins Anaphylaxis    Objective:   There were no vitals taken for this visit.  Patient is in no acute distress.  No labored breathing.  Speech is clear and coherent with logical content.  Patient is alert and oriented at baseline.    Assessment and Plan:   1. Type II diabetes mellitus with unspecified  complications (HCC) We will complete foot exam at next in office appointment.  Lab appointment scheduled for tomorrow a.m. for fasting labs including CMP, A1c and lipid panel.  Continue ARB.  Fasting glucose noted that patient is above goal.  Will assess renal function to see if it safe to increase Metformin further versus adding on an additional antidiabetic agent.  Patient with a significantly sedentary lifestyle and very poor diet.  Dietary and exercise recommendations reviewed with patient.  He is going to work on this.    2. Essential hypertension, benign Patient endorses normotensive home BP readings.  Per review of prior visits, blood pressure has been well controlled on this regimen for some time.  We will continue current regimen.  Fasting labs tomorrow.  3. Chronic gout without tophus, unspecified cause, unspecified site Continue allopurinol 100 mg daily.  Prior flares were secondary to alcohol consumption.  Is currently on a thiazide diuretic in combination with ARB.  This has been the only regimen that has worked for his blood pressure and we will need to continue.  Will check uric acid level tomorrow at lab appointment.  4. Mixed dyslipidemia Prior history in conjunction with diabetes and family history of coronary artery disease.  Patient previously declined statin.  Repeat fasting lipid panel tomorrow.  Will readdress treatment at time of result.  5. Peripheral edema Mild.  Secondary to poor diet and significant sedentary lifestyle.  Recommend he elevate legs while resting.  Increase aerobic activity.  Needs to substantially decrease sodium intake.  Keep well-hydrated.  Will give small prescription for a few tablets of Lasix to help reset things.  This is not a longer term solution for him giving history of gout.  He is aware he really needs to work on lifestyle.  Follow-up in office if not improving/resolving.  6. Colon cancer screening Previously referred to GI.  Screening  colonoscopy was not scheduled.  Will refer back to GI for screening exam.    Leeanne Rio, PA-C 06/04/2019

## 2019-06-04 NOTE — Telephone Encounter (Signed)
LVM to return call, pt need F/U DM appointment

## 2019-06-04 NOTE — Progress Notes (Signed)
I have discussed the procedure for the virtual visit with the patient who has given consent to proceed with assessment and treatment.  Patient unable to get vitals.  Fritz Pickerel, CMA

## 2019-06-05 ENCOUNTER — Other Ambulatory Visit: Payer: Self-pay

## 2019-06-05 ENCOUNTER — Ambulatory Visit (INDEPENDENT_AMBULATORY_CARE_PROVIDER_SITE_OTHER): Payer: BC Managed Care – PPO | Admitting: Emergency Medicine

## 2019-06-05 DIAGNOSIS — E118 Type 2 diabetes mellitus with unspecified complications: Secondary | ICD-10-CM

## 2019-06-05 DIAGNOSIS — M1A9XX0 Chronic gout, unspecified, without tophus (tophi): Secondary | ICD-10-CM

## 2019-06-05 DIAGNOSIS — E782 Mixed hyperlipidemia: Secondary | ICD-10-CM | POA: Diagnosis not present

## 2019-06-05 LAB — COMPREHENSIVE METABOLIC PANEL
ALT: 51 U/L (ref 0–53)
AST: 36 U/L (ref 0–37)
Albumin: 3.8 g/dL (ref 3.5–5.2)
Alkaline Phosphatase: 85 U/L (ref 39–117)
BUN: 15 mg/dL (ref 6–23)
CO2: 29 mEq/L (ref 19–32)
Calcium: 8.9 mg/dL (ref 8.4–10.5)
Chloride: 103 mEq/L (ref 96–112)
Creatinine, Ser: 0.93 mg/dL (ref 0.40–1.50)
GFR: 84.17 mL/min (ref >60.00)
Glucose, Bld: 217 mg/dL — ABNORMAL HIGH (ref 70–99)
Potassium: 4.3 mEq/L (ref 3.5–5.1)
Sodium: 139 mEq/L (ref 135–145)
Total Bilirubin: 1.1 mg/dL (ref 0.2–1.2)
Total Protein: 6.6 g/dL (ref 6.0–8.3)

## 2019-06-05 LAB — URIC ACID: Uric Acid, Serum: 7 mg/dL (ref 4.0–7.8)

## 2019-06-05 LAB — LIPID PANEL
Cholesterol: 179 mg/dL (ref 0–200)
HDL: 36.8 mg/dL — ABNORMAL LOW (ref >39.00)
LDL Cholesterol: 114 mg/dL — ABNORMAL HIGH (ref 0–99)
NonHDL: 142.06
Total CHOL/HDL Ratio: 5
Triglycerides: 138 mg/dL (ref 0.0–149.0)
VLDL: 27.6 mg/dL (ref 0.0–40.0)

## 2019-06-05 LAB — HEMOGLOBIN A1C: Hgb A1c MFr Bld: 9.1 % — ABNORMAL HIGH (ref 4.6–6.5)

## 2019-06-06 NOTE — Telephone Encounter (Signed)
Patient recently seen, waiting on PCP to review notes to determine if any changes in medication are needed

## 2019-06-09 NOTE — Telephone Encounter (Signed)
Ok to send in ? Per patina's note she was waiting for you to review notes

## 2019-07-14 ENCOUNTER — Encounter: Payer: Self-pay | Admitting: Physician Assistant

## 2019-07-14 ENCOUNTER — Ambulatory Visit (INDEPENDENT_AMBULATORY_CARE_PROVIDER_SITE_OTHER): Payer: BC Managed Care – PPO | Admitting: Physician Assistant

## 2019-07-14 ENCOUNTER — Other Ambulatory Visit: Payer: Self-pay

## 2019-07-14 DIAGNOSIS — E118 Type 2 diabetes mellitus with unspecified complications: Secondary | ICD-10-CM | POA: Diagnosis not present

## 2019-07-14 DIAGNOSIS — E119 Type 2 diabetes mellitus without complications: Secondary | ICD-10-CM

## 2019-07-14 NOTE — Progress Notes (Signed)
Virtual Visit via Telephone Note  I connected with Louis Davis on 07/14/19 at  4:00 PM EST by telephone and verified that I am speaking with the correct person using two identifiers.  Location: Patient: Home Provider: [facility]   I discussed the limitations, risks, security and privacy concerns of performing an evaluation and management service by telephone and the availability of in person appointments. I also discussed with the patient that there may be a patient responsible charge related to this service. The patient expressed understanding and agreed to proceed.  History of Present Illness: Patient presents via telephone today for DM follow-up as he is unable to use the video platform.  At last visit patient Metformin was increased to 2000 mg total daily.  Patient endorses taking medication as directed.  Tolerating well.  Is working on diet, cutting out most sugars and carbs from his daily intake.  When he has checked glucose recently it has been around 150 fasting.  Notes this is an improvement for him.  No regular exercise at present, but is working on this.  At last visit cholesterol noted to be quite elevated.  Giving status as a diabetic statin was recommended but patient declined.  Is taking his Benicar HCT daily as directed.Patient denies chest pain, palpitations, lightheadedness, dizziness, vision changes or frequent headaches.   Observations/Objective: No labored breathing.  Speech is clear and coherent with logical content.  Patient is alert and oriented at baseline. .  Assessment and Plan: 1. Diabetes mellitus due to underlying condition with unspecified complications ([place]) Is working hard on diet.  Fasting sugars are improving.  We will continue current medication regimen.  Exercise recommendations reviewed.  Patient to follow-up in office in April for repeat A1c, foot exam and so further adjustments can be made if sugars are not getting to  goal.  Follow Up Instructions:  I discussed the assessment and treatment plan with the patient. The patient was provided an opportunity to ask questions and all were answered. The patient agreed with the plan and demonstrated an understanding of the instructions.   The patient was advised to call back or seek an in-person evaluation if the symptoms worsen or if the condition fails to improve as anticipated.  I provided 10 minutes of non-face-to-face time during this encounter.   Leeanne Rio, PA-C

## 2019-07-14 NOTE — Progress Notes (Signed)
I have discussed the procedure for the virtual visit with the patient who has given consent to proceed with assessment and treatment.   Brentin Shin S Sebastian Lurz, CMA     

## 2019-07-21 ENCOUNTER — Encounter: Payer: Self-pay | Admitting: Physician Assistant

## 2019-09-07 ENCOUNTER — Other Ambulatory Visit: Payer: Self-pay | Admitting: Physician Assistant

## 2019-09-07 DIAGNOSIS — E118 Type 2 diabetes mellitus with unspecified complications: Secondary | ICD-10-CM

## 2019-09-24 ENCOUNTER — Other Ambulatory Visit: Payer: Self-pay | Admitting: Physician Assistant

## 2019-12-26 ENCOUNTER — Other Ambulatory Visit: Payer: Self-pay | Admitting: Emergency Medicine

## 2019-12-26 DIAGNOSIS — E782 Mixed hyperlipidemia: Secondary | ICD-10-CM

## 2019-12-26 MED ORDER — ATORVASTATIN CALCIUM 10 MG PO TABS: 10.0000 mg | ORAL_TABLET | Freq: Every day | ORAL | 0 refills | Status: DC

## 2020-01-12 ENCOUNTER — Other Ambulatory Visit: Payer: Self-pay | Admitting: Physician Assistant

## 2020-01-24 ENCOUNTER — Other Ambulatory Visit: Payer: Self-pay | Admitting: Physician Assistant

## 2020-01-24 DIAGNOSIS — M1A9XX0 Chronic gout, unspecified, without tophus (tophi): Secondary | ICD-10-CM

## 2020-01-27 ENCOUNTER — Other Ambulatory Visit: Payer: Self-pay | Admitting: Physician Assistant

## 2020-01-27 DIAGNOSIS — E782 Mixed hyperlipidemia: Secondary | ICD-10-CM

## 2020-01-27 NOTE — Telephone Encounter (Signed)
Please advise? Pt last seen in February and is diabetic.

## 2020-01-28 NOTE — Telephone Encounter (Signed)
Left detailed message on VM of patient to call back to schedule an appointment for diabetes. Can be in office or video and schedule lab appointment a different day.

## 2020-02-05 ENCOUNTER — Other Ambulatory Visit: Payer: Self-pay | Admitting: Physician Assistant

## 2020-02-26 ENCOUNTER — Other Ambulatory Visit: Payer: Self-pay | Admitting: Physician Assistant

## 2020-02-26 DIAGNOSIS — M1A9XX0 Chronic gout, unspecified, without tophus (tophi): Secondary | ICD-10-CM

## 2020-02-29 DIAGNOSIS — U071 COVID-19: Secondary | ICD-10-CM | POA: Diagnosis not present

## 2020-03-03 ENCOUNTER — Encounter: Payer: Self-pay | Admitting: Physician Assistant

## 2020-03-03 ENCOUNTER — Telehealth (INDEPENDENT_AMBULATORY_CARE_PROVIDER_SITE_OTHER): Payer: BC Managed Care – PPO | Admitting: Physician Assistant

## 2020-03-03 ENCOUNTER — Other Ambulatory Visit: Payer: Self-pay

## 2020-03-03 VITALS — HR 71 | Temp 97.4°F | Wt 283.0 lb

## 2020-03-03 DIAGNOSIS — E782 Mixed hyperlipidemia: Secondary | ICD-10-CM

## 2020-03-03 DIAGNOSIS — U071 COVID-19: Secondary | ICD-10-CM

## 2020-03-03 DIAGNOSIS — I10 Essential (primary) hypertension: Secondary | ICD-10-CM

## 2020-03-03 DIAGNOSIS — E088 Diabetes mellitus due to underlying condition with unspecified complications: Secondary | ICD-10-CM

## 2020-03-03 MED ORDER — DAPAGLIFLOZIN PROPANEDIOL 5 MG PO TABS: 5.0000 mg | ORAL_TABLET | Freq: Every day | ORAL | 1 refills | Status: DC

## 2020-03-03 NOTE — Progress Notes (Signed)
I have discussed the procedure for the virtual visit with the patient who has given consent to proceed with assessment and treatment.   Louis Davis L Louis Davis, CMA     

## 2020-03-03 NOTE — Progress Notes (Signed)
Virtual Visit via Video   I connected with patient on 03/03/20 at  9:30 AM EDT by a video enabled telemedicine application and verified that I am speaking with the correct person using two identifiers.  Location patient: Home Location provider: [facility], Office Persons participating in the virtual visit: Patient, Provider, [name]  I discussed the limitations of evaluation and management by telemedicine and the availability of in person appointments. The patient expressed understanding and agreed to proceed.  Subjective:   HPI:   Patient presents via [facility] today for follow-up of hypertension, hyperlipidemia and DM without known complication.  DM -- Currently on a regimen of Metformin XR 1000 mg BID. Endorses taking medication as directed and still tolerating well. Notes trying to watch diet and keep more active but he is not always successful. Notes glucose in the low 200s when checking. Denies vision changes, numbness/tingling of extremities.   Hypertension -- Is currently on a regimen of Benicar-HCT 40-25 mg daily. Endorses taking as directed. Tolerating well. Patient denies chest pain, palpitations, lightheadedness, dizziness, vision changes or frequent headaches.  Hyperlipidemia -- Taking Lipitor 10 mg daily along with 81 mg ASA daily.   ROS:   See pertinent positives and negatives per HPI.  Patient Active Problem List   Diagnosis Date Noted   Need for hepatitis C screening test 10/16/2017   Diabetes mellitus due to underlying condition with unspecified complications ([place]) 63/78/5885   Mixed dyslipidemia 02/05/2017   Plantar fasciitis of left foot 11/21/2016   Family history of early CAD 07/19/2016   Colon cancer screening 07/19/2016   Prostate cancer screening 07/19/2016   Gout, chronic 08/30/2015   Visit for preventive health examination 09/12/2013   Essential hypertension, benign 08/27/2013    Social History   Tobacco Use    Smoking status: Former Smoker    Quit date: 05/14/2014    Years since quitting: 5.8   Smokeless tobacco: Never Used  Substance Use Topics   Alcohol use: Yes    Alcohol/week: 7.0 standard drinks    Types: 7 Standard drinks or equivalent per week    Comment: occasional    Current Outpatient Medications:    allopurinol (ZYLOPRIM) 100 MG tablet, TAKE 1 TABLET BY MOUTH EVERY DAY, Disp: 30 tablet, Rfl: 0   aspirin 81 MG tablet, Take 81 mg by mouth daily., Disp: , Rfl:    atorvastatin (LIPITOR) 10 MG tablet, Take 1 tablet (10 mg total) by mouth daily. Please schedule an appointment for follow up and refills, Disp: 30 tablet, Rfl: 0   furosemide (LASIX) 20 MG tablet, Take 1 tablet (20 mg total) by mouth daily., Disp: 10 tablet, Rfl: 0   metFORMIN (GLUCOPHAGE-XR) 500 MG 24 hr tablet, TAKE 2 TABLETS BY MOUTH TWICE A DAY, Disp: 360 tablet, Rfl: 1   olmesartan-hydrochlorothiazide (BENICAR HCT) 40-25 MG tablet, Take 1 tablet by mouth daily. Please schedule diabetic follow up for more refills., Disp: 30 tablet, Rfl: 0  Allergies  Allergen Reactions   Penicillins Anaphylaxis    Objective:   Pulse 71    Temp (!) 97.4 F (36.3 C) (Temporal)    Wt 283 lb (128.4 kg)    SpO2 97%    BMI 37.34 kg/m   Patient is well-developed, well-nourished in no acute distress.  Resting comfortably at home.  Head is normocephalic, atraumatic.  No labored breathing.  Speech is clear and coherent with logical content.  Patient is alert and oriented at baseline.   Assessment and Plan:  1. Diabetes mellitus due to underlying condition with unspecified complications ([place]) Will have patient come in for repeat labs -- CMP, Lipids, A1C. Will go ahead and add on Farxiga 5 mg to his current regimen. Plan for him to continue daily glucose checks and record over the next few weeks. Bring to lab visit.   2. Essential hypertension, benign Continue current regimen. Home BP ranging 120s/70-80s.   3. Mixed  dyslipidemia Continue statin. Dietary and exercise recommendations reviewed. Lab appt scheduled for repeat fasting lipids.   4. COVID-19 virus infection Noted at end of visit. Diagnosed Sunday 10/10 after exposure. Mild symptoms starting 10/11. Notes mild cough. Is taking Vitamin C, Vitamin D and Zinc per Urgent Care recommendations. Is candidate for antibody infusion due to diabetes and weight. Will send note to infusion center to set patient up for this. Strict ER precautions reviewed with patient. 10-day quarantine discussed.     Leeanne Rio, PA-C 03/03/2020\

## 2020-03-04 ENCOUNTER — Telehealth: Payer: Self-pay | Admitting: Nurse Practitioner

## 2020-03-04 NOTE — Telephone Encounter (Signed)
Called to Discuss with patient about Covid symptoms and the use of the monoclonal antibody infusion for those with mild to moderate Covid symptoms and at a high risk of hospitalization.     Pt appears to qualify for this infusion due to co-morbid conditions and/or a member of an at-risk group in accordance with the FDA Emergency Use Authorization.    Unable to reach pt. Voicemail left and My chart message sent.   Nikki Pickenpack-Cousar, NP [facility]  [phone]    

## 2020-03-10 DIAGNOSIS — Z03818 Encounter for observation for suspected exposure to other biological agents ruled out: Secondary | ICD-10-CM | POA: Diagnosis not present

## 2020-03-10 DIAGNOSIS — Z1152 Encounter for screening for COVID-19: Secondary | ICD-10-CM | POA: Diagnosis not present

## 2020-03-13 ENCOUNTER — Other Ambulatory Visit: Payer: Self-pay | Admitting: Physician Assistant

## 2020-03-15 DIAGNOSIS — Z03818 Encounter for observation for suspected exposure to other biological agents ruled out: Secondary | ICD-10-CM | POA: Diagnosis not present

## 2020-03-15 DIAGNOSIS — Z1152 Encounter for screening for COVID-19: Secondary | ICD-10-CM | POA: Diagnosis not present

## 2020-03-19 ENCOUNTER — Other Ambulatory Visit: Payer: Self-pay | Admitting: Physician Assistant

## 2020-03-19 DIAGNOSIS — E118 Type 2 diabetes mellitus with unspecified complications: Secondary | ICD-10-CM

## 2020-03-19 DIAGNOSIS — Z1152 Encounter for screening for COVID-19: Secondary | ICD-10-CM | POA: Diagnosis not present

## 2020-03-19 DIAGNOSIS — Z03818 Encounter for observation for suspected exposure to other biological agents ruled out: Secondary | ICD-10-CM | POA: Diagnosis not present

## 2020-03-24 DIAGNOSIS — Z03818 Encounter for observation for suspected exposure to other biological agents ruled out: Secondary | ICD-10-CM | POA: Diagnosis not present

## 2020-03-31 ENCOUNTER — Other Ambulatory Visit: Payer: Self-pay | Admitting: Emergency Medicine

## 2020-03-31 ENCOUNTER — Encounter: Payer: Self-pay | Admitting: Emergency Medicine

## 2020-03-31 ENCOUNTER — Other Ambulatory Visit: Payer: Self-pay | Admitting: Physician Assistant

## 2020-03-31 DIAGNOSIS — M1A9XX0 Chronic gout, unspecified, without tophus (tophi): Secondary | ICD-10-CM

## 2020-03-31 DIAGNOSIS — E088 Diabetes mellitus due to underlying condition with unspecified complications: Secondary | ICD-10-CM

## 2020-03-31 DIAGNOSIS — E782 Mixed hyperlipidemia: Secondary | ICD-10-CM

## 2020-03-31 DIAGNOSIS — I10 Essential (primary) hypertension: Secondary | ICD-10-CM

## 2020-04-25 ENCOUNTER — Other Ambulatory Visit: Payer: Self-pay | Admitting: Physician Assistant

## 2020-04-25 DIAGNOSIS — M1A9XX0 Chronic gout, unspecified, without tophus (tophi): Secondary | ICD-10-CM

## 2020-05-23 DIAGNOSIS — J45998 Other asthma: Secondary | ICD-10-CM | POA: Diagnosis not present

## 2020-05-30 ENCOUNTER — Other Ambulatory Visit: Payer: Self-pay | Admitting: Physician Assistant

## 2020-08-04 ENCOUNTER — Other Ambulatory Visit: Payer: Self-pay | Admitting: Physician Assistant

## 2020-08-27 DIAGNOSIS — E119 Type 2 diabetes mellitus without complications: Secondary | ICD-10-CM | POA: Diagnosis not present

## 2020-08-27 LAB — HM DIABETES EYE EXAM

## 2020-09-20 ENCOUNTER — Telehealth: Payer: Self-pay

## 2020-09-20 ENCOUNTER — Other Ambulatory Visit: Payer: Self-pay

## 2020-09-20 DIAGNOSIS — E118 Type 2 diabetes mellitus with unspecified complications: Secondary | ICD-10-CM

## 2020-09-20 MED ORDER — METFORMIN HCL ER 500 MG PO TB24: 1000.0000 mg | ORAL_TABLET | Freq: Two times a day (BID) | ORAL | 0 refills | Status: DC

## 2020-09-20 NOTE — Telephone Encounter (Signed)
  LAST APPOINTMENT DATE: 03/03/2020   NEXT APPOINTMENT DATE:11/09/2020  MEDICATION:metFORMIN (GLUCOPHAGE-XR) 500 MG 24 hr tablet // olmesartan-hydrochlorothiazide (BENICAR HCT) 40-25 MG tablet  PHARMACY:[id number] [place], [place] - [address]  Comments: Patient is completely out.  Please advise

## 2020-09-20 NOTE — Telephone Encounter (Signed)
Prescription sent to pharmacy.

## 2020-09-22 ENCOUNTER — Telehealth: Payer: Self-pay | Admitting: Physician Assistant

## 2020-09-22 ENCOUNTER — Ambulatory Visit: Payer: BC Managed Care – PPO | Admitting: Family Medicine

## 2020-09-22 ENCOUNTER — Other Ambulatory Visit: Payer: Self-pay

## 2020-09-22 ENCOUNTER — Encounter: Payer: Self-pay | Admitting: Family Medicine

## 2020-09-22 ENCOUNTER — Ambulatory Visit (INDEPENDENT_AMBULATORY_CARE_PROVIDER_SITE_OTHER): Payer: BC Managed Care – PPO

## 2020-09-22 VITALS — BP 142/98 | HR 87 | Ht 73.0 in | Wt 287.0 lb

## 2020-09-22 DIAGNOSIS — M5412 Radiculopathy, cervical region: Secondary | ICD-10-CM

## 2020-09-22 DIAGNOSIS — M5033 Other cervical disc degeneration, cervicothoracic region: Secondary | ICD-10-CM | POA: Diagnosis not present

## 2020-09-22 DIAGNOSIS — E088 Diabetes mellitus due to underlying condition with unspecified complications: Secondary | ICD-10-CM | POA: Diagnosis not present

## 2020-09-22 MED ORDER — GABAPENTIN 300 MG PO CAPS: 300.0000 mg | ORAL_CAPSULE | Freq: Three times a day (TID) | ORAL | 3 refills | Status: AC | PRN

## 2020-09-22 MED ORDER — PREDNISONE 50 MG PO TABS: 50.0000 mg | ORAL_TABLET | Freq: Every day | ORAL | 0 refills | Status: DC

## 2020-09-22 MED ORDER — OLMESARTAN MEDOXOMIL-HCTZ 40-25 MG PO TABS: 1.0000 | ORAL_TABLET | Freq: Every day | ORAL | 0 refills | Status: DC

## 2020-09-22 NOTE — Patient Instructions (Addendum)
Thank you for coming in today.  Take the gabapentin as needed for nerve pain.   Please get an Xray today before you leave  Please get labs today before you leave  Take the prednisone if really bad.   Recheck in 1 month.   If not better next step may be MRI.    Cervical Radiculopathy  Cervical radiculopathy happens when a nerve in the neck (a cervical nerve) is pinched or bruised. This condition can happen because of an injury to the cervical spine (vertebrae) in the neck, or as part of the normal aging process. Pressure on the cervical nerves can cause pain or numbness that travels from the neck all the way down into the arm and fingers. Usually, this condition gets better with rest. Treatment may be needed if the condition does not improve. What are the causes? This condition may be caused by:  A neck injury.  A bulging (herniated) disk.  Muscle spasms.  Muscle tightness in the neck because of overuse.  Arthritis.  Breakdown or degeneration in the bones and joints of the spine (spondylosis) due to aging.  Bone spurs that may develop near the cervical nerves. What are the signs or symptoms? Symptoms of this condition include:  Pain. The pain may travel from the neck to the arm and hand. The pain can be severe or irritating. It may be worse when you move your neck.  Numbness or tingling in your arm or hand.  Weakness in the affected arm and hand, in severe cases. How is this diagnosed? This condition may be diagnosed based on your symptoms, your medical history, and a physical exam. You may also have tests, including:  X-rays.  A CT scan.  An MRI.  An electromyogram (EMG).  Nerve conduction tests. How is this treated? In many cases, treatment is not needed for this condition. With rest, the condition usually gets better over time. If treatment is needed, options may include:  Wearing a soft neck collar (cervical collar) for short periods of time, as told by  your health care provider.  Doing physical therapy to strengthen your neck muscles.  Taking medicines, such as NSAIDs or oral corticosteroids.  Having spinal injections, in severe cases.  Having surgery. This may be needed if other treatments do not help. Different types of surgery may be done depending on the cause of this condition. Follow these instructions at home: If you have a cervical collar:  Wear it as told by your health care provider. Remove it only as told by your health care provider.  Ask your health care provider if you can remove the collar for cleaning and bathing. If you are allowed to remove the collar for cleaning or bathing: ? Follow instructions from your health care provider about how to remove the collar safely. ? Clean the collar by wiping it with mild soap and water and drying it completely. ? Take out any removable pads in the collar every 1-2 days, and wash them by hand with soap and water. Let them air-dry completely before you put them back in the collar. ? Check your skin under the collar for irritation or sores. If you see any, tell your health care provider. Managing pain  Take over-the-counter and prescription medicines only as told by your health care provider.  If directed, put ice on the affected area. ? If you have a soft neck collar, remove it as told by your health care provider. ? Put ice in a plastic  bag. ? Place a towel between your skin and the bag. ? Leave the ice on for 20 minutes, 2-3 times a day.  If applying ice does not help, you can try using heat. Use the heat source that your health care provider recommends, such as a moist heat pack or a heating pad. ? Place a towel between your skin and the heat source. ? Leave the heat on for 20-30 minutes. ? Remove the heat if your skin turns bright red. This is especially important if you are unable to feel pain, heat, or cold. You may have a greater risk of getting burned.  Try a gentle neck  and shoulder massage to help relieve symptoms.      Activity  Rest as needed.  Return to your normal activities as told by your health care provider. Ask your health care provider what activities are safe for you.  Do stretching and strengthening exercises as told by your health care provider or physical therapist.  Do not lift anything that is heavier than 10 lb (4.5 kg) until your health care provider tells you that it is safe. General instructions  Use a flat pillow when you sleep.  Do not drive while wearing a cervical collar. If you do not have a cervical collar, ask your health care provider if it is safe to drive while your neck heals.  Ask your health care provider if the medicine prescribed to you requires you to avoid driving or using heavy machinery.  Do not use any products that contain nicotine or tobacco, such as cigarettes, e-cigarettes, and chewing tobacco. These can delay healing. If you need help quitting, ask your health care provider.  Keep all follow-up visits as told by your health care provider. This is important. Contact a health care provider if:  Your condition does not improve with treatment. Get help right away if:  Your pain gets much worse and cannot be controlled with medicines.  You have weakness or numbness in your hand, arm, face, or leg.  You have a high fever.  You have a stiff, rigid neck.  You lose control of your bowels or your bladder (have incontinence).  You have trouble with walking, balance, or speaking. Summary  Cervical radiculopathy happens when a nerve in the neck is pinched or bruised.  A nerve can get pinched from a bulging disk, arthritis, muscle spasms, or an injury to the neck.  Symptoms include pain, tingling, or numbness radiating from the neck into the arm or hand. Weakness can also occur in severe cases.  Treatment may include rest, wearing a cervical collar, and physical therapy. Medicines may be prescribed to  help with pain. In severe cases, injections or surgery may be needed. This information is not intended to replace advice given to you by your health care provider. Make sure you discuss any questions you have with your health care provider. Document Revised: 03/29/2018 Document Reviewed: 03/29/2018 Elsevier Patient Education  2021 [employer].

## 2020-09-22 NOTE — Telephone Encounter (Signed)
..  Medication Refills  Last OV:  Medication:  Olmesartan  Pharmacy:  [facility]  Let patient know to contact pharmacy at the end of the day to make sure medication is ready.   Please notify patient to allow 48-72 hours to process.  Encourage patient to contact the pharmacy for refills or they can request refills through [place] out below:   Last refill:  QTY:  Refill Date:    Other Comments:   Okay for refill?  Please advise.

## 2020-09-22 NOTE — Progress Notes (Signed)
Rito Ehrlich, am serving as a Education administrator for Dr. Lynne Leader.  Subjective:    CC: Upper back pain  HPI: Pt is a 55 y/o male c/o upper R back pain ongoing X1 month. Pt locates pain to right below R shoulder blade. Patient was in [place] on vacation a month ago and when he got home he realized he was having some pinched nerve type feeling under R shoulder blade that will radiate to R chest and down R arm to elbow. Patient states that he has been trying to give it time to go away but it has been getting better and then go right back to the sharp stabbing pain.   Radiates: yes Aggravates: Looking up for an extended time   Treatments tried: Tylenol, heat, ice, lidocaine roll on, rest, inversion table  Pertinent review of Systems: No fevers or chills  Relevant historical information: neck injury perforated disk and herniated disk; blown both bursas in shoulders around 12 years ago   Early controlled diabetes.  Currently without a primary care provider transitioning care start in about a month and a half   Objective:    Vitals:   09/22/20 1534  BP: (!) 142/98  Pulse: 87  SpO2: 98%   General: Well Developed, well nourished, and in no acute distress.   MSK: C-spine normal. Normal cervical motion. Positive right-sided Spurling's test. Upper extremity strength reflexes and sensation are intact distally.   Lab and Radiology Results  X-ray images cervical spine obtained today personally and independently interpreted Multi level degenerative changes cervical spine without acute fractures. Await formal radiology review   Impression and Recommendations:    Assessment and Plan: 55 y.o. male with right arm pain consistent with C8 and T1 cervical radiculopathy.  Fortunately he does not have weakness.  Additionally he has poorly controlled diabetes.  Normally the situation will be prescribed prednisone and gabapentin.  We will go ahead and do both however recommend holding off on  prednisone.  Refer to physical therapy and treatment gabapentin.  Would not check F0X and metabolic panel to ensure safety for prednisone if needed in the future.  Recheck in 1 month.  Follow-up with PCP ASAP.  PDMP not reviewed this encounter. Orders Placed This Encounter  Procedures  . DG Cervical Spine 2 or 3 views    Standing Status:   Future    Number of Occurrences:   1    Standing Expiration Date:   09/22/2021    Order Specific Question:   Reason for Exam (SYMPTOM  OR DIAGNOSIS REQUIRED)    Answer:   eval rt c8 rad    Order Specific Question:   Preferred imaging location?    Answer:   [facility]  . Hemoglobin A1c    Standing Status:   Future    Number of Occurrences:   1    Standing Expiration Date:   09/22/2021  . COMPLETE METABOLIC PANEL WITH GFR    Standing Status:   Future    Number of Occurrences:   1    Standing Expiration Date:   09/22/2021  . Ambulatory referral to Physical Therapy    Referral Priority:   Routine    Referral Type:   Physical Medicine    Referral Reason:   Specialty Services Required    Requested Specialty:   Physical Therapy    Number of Visits Requested:   1   Meds ordered this encounter  Medications  . gabapentin (NEURONTIN) 300 MG capsule  Sig: Take 1-2 capsules (300-600 mg total) by mouth 3 (three) times daily as needed.    Dispense:  90 capsule    Refill:  3  . predniSONE (DELTASONE) 50 MG tablet    Sig: Take 1 tablet (50 mg total) by mouth daily.    Dispense:  5 tablet    Refill:  0    Discussed warning signs or symptoms. Please see discharge instructions. Patient expresses understanding.   The above documentation has been reviewed and is accurate and complete Lynne Leader, M.D.

## 2020-09-22 NOTE — Telephone Encounter (Signed)
Prescription sent to patient's pharmacy.

## 2020-09-23 LAB — COMPLETE METABOLIC PANEL WITH GFR
AG Ratio: 1.4 (calc) (ref 1.0–2.5)
ALT: 57 U/L — ABNORMAL HIGH (ref 9–46)
AST: 41 U/L — ABNORMAL HIGH (ref 10–35)
Albumin: 4.1 g/dL (ref 3.6–5.1)
Alkaline phosphatase (APISO): 82 U/L (ref 35–144)
BUN: 18 mg/dL (ref 7–25)
CO2: 26 mmol/L (ref 20–32)
Calcium: 9.2 mg/dL (ref 8.6–10.3)
Chloride: 106 mmol/L (ref 98–110)
Creat: 1.11 mg/dL (ref 0.70–1.33)
GFR, Est African American: 86 mL/min/{1.73_m2} (ref >=60)
GFR, Est Non African American: 74 mL/min/{1.73_m2} (ref >=60)
Globulin: 3 g/dL (calc) (ref 1.9–3.7)
Glucose, Bld: 195 mg/dL — ABNORMAL HIGH (ref 65–99)
Potassium: 4.4 mmol/L (ref 3.5–5.3)
Sodium: 142 mmol/L (ref 135–146)
Total Bilirubin: 0.8 mg/dL (ref 0.2–1.2)
Total Protein: 7.1 g/dL (ref 6.1–8.1)

## 2020-09-23 LAB — HEMOGLOBIN A1C: Hgb A1c MFr Bld: 8.4 % — ABNORMAL HIGH (ref 4.6–6.5)

## 2020-09-23 NOTE — Progress Notes (Signed)
Hemoglobin A1c is 8.4 which is better than it was a year ago but certainly could be improved.  Your new doctor will have some work to do but I am optimistic that your diabetes can be better controlled.  Okay to take the prednisone if he needs to.  Kidney function is okay.  Liver enzymes are little bit irritated.  Weight loss will help.

## 2020-09-27 NOTE — Progress Notes (Signed)
Cervical spine x-ray shows multilevel ne arthritis changes

## 2020-10-04 ENCOUNTER — Encounter: Payer: Self-pay | Admitting: Family Medicine

## 2020-10-04 ENCOUNTER — Other Ambulatory Visit: Payer: Self-pay | Admitting: Family

## 2020-10-11 DIAGNOSIS — R202 Paresthesia of skin: Secondary | ICD-10-CM | POA: Diagnosis not present

## 2020-10-11 DIAGNOSIS — M4722 Other spondylosis with radiculopathy, cervical region: Secondary | ICD-10-CM | POA: Diagnosis not present

## 2020-10-11 DIAGNOSIS — M542 Cervicalgia: Secondary | ICD-10-CM | POA: Diagnosis not present

## 2020-10-21 ENCOUNTER — Telehealth: Payer: Self-pay | Admitting: Registered Nurse

## 2020-10-21 ENCOUNTER — Ambulatory Visit: Payer: BC Managed Care – PPO | Admitting: Family Medicine

## 2020-10-21 ENCOUNTER — Other Ambulatory Visit: Payer: Self-pay

## 2020-10-21 VITALS — BP 139/81 | Ht 73.0 in | Wt 284.2 lb

## 2020-10-21 DIAGNOSIS — M5412 Radiculopathy, cervical region: Secondary | ICD-10-CM | POA: Diagnosis not present

## 2020-10-21 DIAGNOSIS — R29898 Other symptoms and signs involving the musculoskeletal system: Secondary | ICD-10-CM

## 2020-10-21 MED ORDER — PREDNISONE 50 MG PO TABS: 50.0000 mg | ORAL_TABLET | Freq: Every day | ORAL | 0 refills | Status: DC

## 2020-10-21 NOTE — Telephone Encounter (Signed)
..  Medication Refills  Last OV:  [facility] [place]  Let patient know to contact pharmacy at the end of the day to make sure medication is ready.   Please notify patient to allow 48-72 hours to process.  Encourage patient to contact the pharmacy for refills or they can request refills through [place] out below:   Last refill:  QTY:  Refill Date:    Other Comments:   Okay for refill?  Please advise.

## 2020-10-21 NOTE — Patient Instructions (Addendum)
Thank you for coming in today.  You should hear from MRI scheduling within 1 week. If you do not hear please let me know.   Prednisone is the emergency back up medicine while you are in [place].   Plan or epidural steroid injection following the  MRI.

## 2020-10-21 NOTE — Progress Notes (Signed)
I, Peterson Lombard, LAT, ATC acting as a scribe for Lynne Leader, MD.  Louis Davis is a 55 y.o. male who presents to [place] at [facility] today for f/u R arm pain consistent with C8 and T1 cervical radiculopathy. Pt was last seen by Dr. Georgina Snell on 09/22/20 and was prescribed prednisone, gabapentin, and referred to [facility] [place] [facility]. Pt was also advised to f/u w/ PCP ASAP. Pt sent a MyChart message on 10/04/20 c/o cont neck pain and hadn't yet scheduled PT. Today, pt reports he had 1 PT session and was given HEP. Pt reports that the exercises he has been doing is making his neck pain worse w/ numbness/tingling down posterior upper arm, elbow, forearm, and into fingers. Pt states that the stabbing pain above R scapula has improved to a dull ache. Pt notes he doesn't like taking the Gabapentin.  He notes some weakness in his right hand with grip strength.  Patient will be traveling to [place] for a diving trip in about 2 weeks.  Dx testing: 09/22/20 C-spine XR  09/22/20 Labs (complete metabolic panel, hemoglobin A1c)  Pertinent review of systems: No fevers or chills  Relevant historical information: Diabetes   Exam:  BP 139/81 (BP Location: Right Arm, Patient Position: Sitting, Cuff Size: Normal)   Ht 6\' 1"  (1.854 m)   Wt 284 lb 3.2 oz (128.9 kg)   BMI 37.50 kg/m  General: Well Developed, well nourished, and in no acute distress.   MSK: C-spine normal. Nontender midline. Normal cervical motion. Upper extremity strength is intact with exception of right grip strength which is diminished approximately 50% compared to left  Reflexes are intact.  Lab and Radiology Results DG Cervical Spine 2 or 3 views  Result Date: 09/25/2020 CLINICAL DATA:  Evaluate right C8 radiculopathy. Right arm pain and numbness for 1 month. EXAM: CERVICAL SPINE - 2-3 VIEW COMPARISON:  None. FINDINGS: Straightening of normal lordosis. No listhesis. There is diffuse degenerative disc disease with disc  space narrowing and endplate spurring, large anterior osteophytes at multiple levels. Only minimal posterior spurring most prominent at C4-C5. There is mild multilevel facet hypertrophy. Lateral masses of C1 are well aligned on C2. No fracture, evidence of focal bone lesion or bony destruction. No prevertebral soft tissue edema. IMPRESSION: Multilevel degenerative disc disease and facet hypertrophy. Straightening of normal lordosis. Electronically Signed   By: Keith Rake M.D.   On: 09/25/2020 16:01   I, Lynne Leader, personally (independently) visualized and performed the interpretation of the images attached in this note.     Assessment and Plan: 55 y.o. male with right cervical radiculopathy likely C8.  Patient has decreased grip strength with new progressive neurological symptoms.  He is failing conservative management.  Plan for MRI for diagnostic evaluation as well as epidural steroid injection planning.  I have prescribed prednisone but I have advised him to not take it now.  Recommend only using prednisone as an emergency backup plan while traveling in [place] if I cannot get an MRI and epidural steroid injection done before then.  Discussed precautions.   PDMP not reviewed this encounter. Orders Placed This Encounter  Procedures  . MR CERVICAL SPINE WO CONTRAST    Standing Status:   Future    Standing Expiration Date:   10/21/2021    Order Specific Question:   What is the patient's sedation requirement?    Answer:   No Sedation    Order Specific Question:   Does the patient have  a pacemaker or implanted devices?    Answer:   No    Order Specific Question:   Preferred imaging location?    Answer:   [facility] development [facility] (table limit-350lbs)   Meds ordered this encounter  Medications  . predniSONE (DELTASONE) 50 MG tablet    Sig: Take 1 tablet (50 mg total) by mouth daily.    Dispense:  5 tablet    Refill:  0     Discussed warning signs or symptoms. Please see discharge  instructions. Patient expresses understanding.   The above documentation has been reviewed and is accurate and complete Lynne Leader, M.D.

## 2020-10-22 ENCOUNTER — Other Ambulatory Visit: Payer: Self-pay

## 2020-10-22 DIAGNOSIS — M1A9XX0 Chronic gout, unspecified, without tophus (tophi): Secondary | ICD-10-CM

## 2020-10-22 MED ORDER — ALLOPURINOL 100 MG PO TABS: 100.0000 mg | ORAL_TABLET | Freq: Every day | ORAL | 1 refills | Status: DC

## 2020-10-22 NOTE — Telephone Encounter (Signed)
Duplicate. Already sent to P. Justin Mend for approval.

## 2020-10-24 ENCOUNTER — Ambulatory Visit (INDEPENDENT_AMBULATORY_CARE_PROVIDER_SITE_OTHER): Payer: BC Managed Care – PPO

## 2020-10-24 ENCOUNTER — Other Ambulatory Visit: Payer: Self-pay

## 2020-10-24 DIAGNOSIS — M4722 Other spondylosis with radiculopathy, cervical region: Secondary | ICD-10-CM | POA: Diagnosis not present

## 2020-10-24 DIAGNOSIS — M5011 Cervical disc disorder with radiculopathy,  high cervical region: Secondary | ICD-10-CM | POA: Diagnosis not present

## 2020-10-24 DIAGNOSIS — M5412 Radiculopathy, cervical region: Secondary | ICD-10-CM

## 2020-10-24 DIAGNOSIS — G9589 Other specified diseases of spinal cord: Secondary | ICD-10-CM | POA: Diagnosis not present

## 2020-10-24 DIAGNOSIS — M50123 Cervical disc disorder at C6-C7 level with radiculopathy: Secondary | ICD-10-CM | POA: Diagnosis not present

## 2020-10-25 ENCOUNTER — Telehealth: Payer: Self-pay | Admitting: Family Medicine

## 2020-10-25 DIAGNOSIS — M5412 Radiculopathy, cervical region: Secondary | ICD-10-CM

## 2020-10-25 NOTE — Telephone Encounter (Signed)
Called pt and advised him to call [facility] to schedule his ESI.  Pt verbalizes understanding.

## 2020-10-25 NOTE — Telephone Encounter (Signed)
Epidural steroid injection ordered.  Please contact [facility] at [phone] to schedule. 

## 2020-10-25 NOTE — Progress Notes (Signed)
MRI cervical spine shows spinal stenosis.  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  However there is also levels where the spinal cord is being pinched worse.  I would recommend trying an injection however if this does not help much you should have consultation with spine surgery.  I have already ordered an injection.  Please call [facility] at [phone] to schedule.  Please schedule a follow-up appointment with me to go over the results of the MRI in full detail.

## 2020-10-26 ENCOUNTER — Ambulatory Visit
Admission: RE | Admit: 2020-10-26 | Discharge: 2020-10-26 | Disposition: A | Discharge status: Home / Self Care | Payer: BC Managed Care – PPO | Source: Ambulatory Visit | Attending: Family Medicine | Admitting: Family Medicine

## 2020-10-26 ENCOUNTER — Other Ambulatory Visit: Payer: Self-pay

## 2020-10-26 DIAGNOSIS — M4802 Spinal stenosis, cervical region: Secondary | ICD-10-CM | POA: Diagnosis not present

## 2020-10-26 DIAGNOSIS — M47812 Spondylosis without myelopathy or radiculopathy, cervical region: Secondary | ICD-10-CM | POA: Diagnosis not present

## 2020-10-26 DIAGNOSIS — M5412 Radiculopathy, cervical region: Secondary | ICD-10-CM

## 2020-10-26 MED ORDER — DAPAGLIFLOZIN PROPANEDIOL 5 MG PO TABS: ORAL_TABLET | ORAL | 0 refills | Status: DC

## 2020-10-26 MED ORDER — IOPAMIDOL (ISOVUE-M 300) INJECTION 61%
1.0000 mL | Freq: Once | INTRAMUSCULAR | Status: AC | PRN
  Administered 2020-10-26: 1 mL via EPIDURAL

## 2020-10-26 MED ORDER — TRIAMCINOLONE ACETONIDE 40 MG/ML IJ SUSP (RADIOLOGY)
60.0000 mg | Freq: Once | INTRAMUSCULAR | Status: AC
  Administered 2020-10-26: 60 mg via EPIDURAL

## 2020-10-26 NOTE — Telephone Encounter (Signed)
Medication sent to pharmacy  

## 2020-10-26 NOTE — Telephone Encounter (Signed)
Please send in the [place] pt has been out for 10 days last time it was filled was in March 23 with only 1 refill  Please advise   [facility] [place]

## 2020-10-26 NOTE — Discharge Instructions (Signed)
Post Procedure Spinal Discharge Instruction Sheet  1. You may resume a regular diet and any medications that you routinely take (including pain medications).  2. No driving day of procedure.  3. Light activity throughout the rest of the day.  Do not do any strenuous work, exercise, bending or lifting.  The day following the procedure, you can resume normal physical activity but you should refrain from exercising or physical therapy for at least three days thereafter.   Common Side Effects:   Headaches- take your usual medications as directed by your physician.  Increase your fluid intake.  Caffeinated beverages may be helpful.  Lie flat in bed until your headache resolves.   Restlessness or inability to sleep- you may have trouble sleeping for the next few days.  Ask your referring physician if you need any medication for sleep.   Facial flushing or redness- should subside within a few days.   Increased pain- a temporary increase in pain a day or two following your procedure is not unusual.  Take your pain medication as prescribed by your referring physician.   Leg cramps  Please contact our office at [phone] for the following symptoms:  Fever greater than 100 degrees.  Headaches unresolved with medication after 2-3 days.  Increased swelling, pain, or redness at injection site.   Thank you for visiting [facility] today.  

## 2020-10-27 NOTE — Progress Notes (Signed)
I, Wendy Poet, LAT, ATC, am serving as scribe for Dr. Lynne Leader.  Louis Davis is a 55 y.o. male who presents to [facility] today for f/u of neck/R arm pain and c-spine MRI review.  He was last seen by Dr. Georgina Snell on 10/21/20 and noted worsening symptoms after going to PT, making his neck pain worse w/ numbness/tingling down posterior upper arm, elbow, forearm, and into fingers.  He was referred for a c-spine MRI in hopes of getting an ESI ordered/done prior to the pt's trip to [place] [facility] in a couple weeks.  He had a R C7-T1 ESI on 10/26/20.  Today, pt reports that he is feeling better, rating his improvement at 30%.  He reports no pain in the R arm, just con't numbness/tingling in his R forearm and into the R 4th and 5th fingers.    Diagnostic testing: c-spine MRI- 10/24/20; C-spine XR- 09/22/20   Pertinent review of systems: No fevers or chills  Relevant historical information: No history of COPD pneumothorax severe sinusitis Patient has an upcoming scuba diving trip in about 1 week.   Exam:  BP 130/72 (BP Location: Left Arm, Patient Position: Sitting, Cuff Size: Large)   Pulse 60   Ht 6\' 1"  (1.854 m)   Wt 284 lb 9.6 oz (129.1 kg)   SpO2 98%   BMI 37.55 kg/m  General: Well Developed, well nourished, and in no acute distress.   MSK: C-spine decreased cervical motion.  Upper extremity strength is intact.    Lab and Radiology Results No results found for this or any previous visit (from the past 72 hour(s)). MR CERVICAL SPINE WO CONTRAST  Result Date: 10/24/2020 CLINICAL DATA:  Cervical radiculopathy over the last 2 months. Right C8 radicular symptoms described at the previous radiography. Specific symptoms not given in today's history. EXAM: MRI CERVICAL SPINE WITHOUT CONTRAST TECHNIQUE: Multiplanar, multisequence MR imaging of the cervical spine was performed. No intravenous contrast was administered. COMPARISON:  Radiography 09/22/2020 FINDINGS: Alignment:  Straightening of the normal cervical lordosis. Vertebrae: No fracture or primary bone lesion. Cord: Some myelomalacia of the cord from mid C4 to mid C5. No evidence of ongoing cord compression in that location. Cord flattening and deformity on the right at C5-6 without abnormal T2 signal. See below for details at each level. Posterior Fossa, vertebral arteries, paraspinal tissues: Negative Disc levels: Foramen magnum is widely patent. Mild arthritic change at the C 0 C1 articulation. C1-2 shows ordinary osteoarthritis without encroachment upon the neural structures. C2-3: No disc pathology or facet pathology.  No stenosis. C3-4: Bulging of the disc. Bilateral uncovertebral prominence. AP diameter of the canal in the midline 7.3 mm. Mild bilateral foraminal narrowing. C4-5: Endplate osteophytes and bulging of the disc. Uncovertebral hypertrophy on the right. AP diameter of the canal in the midline 7.9 mm. As noted above, there is myelomalacia of the cord at this level with atrophy. Right foraminal stenosis could affect the right C5 nerve. C5-6: Spondylosis with right-sided predominant osteophyte formation. Canal narrowing with AP diameter in the midline of 6.7 mm. Effacement of the subarachnoid space and deformity of the right side of the cord. Bilateral foraminal stenosis, right worse than left. Either C6 nerve could be affected, more likely the right. C6-7: Endplate osteophytes and bulging of the disc more prominent towards the left. Canal stenosis with AP diameter in the midline 6.4 mm. Effacement of the subarachnoid space and slight indentation of cord. Foraminal narrowing left more than right.  The left C7 nerve could be affected. C7-T1: No significant degenerative change. No canal or foraminal stenosis. IMPRESSION: Congenital stenosis of the canal, worsened by superimposed degenerative changes. See above for details at each level. At the C4-5 level, there is chronic myelomalacia and atrophy of the cord, without  actual ongoing compression seen currently. The AP diameter of the canal at this level is 7.9 mm, larger than at the C3-4, C5-6 and C6-7 levels. Cord deformity presently is most severe on the right at C5-6, where myelomalacia could result in the future, though no abnormal cord T2 signal is visible presently. Foraminal stenosis that could cause neural compression on the right at C4-5, bilateral at C5-6 right worse than left, and on the left at C6-7. See above for details at each level. Electronically Signed   By: Nelson Chimes M.D.   On: 10/24/2020 18:47   DG INJECT DIAG/THERA/INC NEEDLE/CATH/PLC EPI/LUMB/SAC W/IMG  Result Date: 10/26/2020 CLINICAL DATA:  Right C8 radicular symptoms. Multilevel spondylosis and stenosis. FLUOROSCOPY TIME:  0 minutes 33 seconds. 26.14 micro gray meter squared PROCEDURE: CERVICAL EPIDURAL INJECTION An interlaminar approach was performed on the right at C7-T1. A 20 gauge epidural needle was advanced using loss-of-resistance technique. DIAGNOSTIC EPIDURAL INJECTION Injection of Isovue-M 300 shows a good epidural pattern with spread above and below the level of needle placement, primarily on the right. No vascular opacification is seen. THERAPEUTIC EPIDURAL INJECTION 1.5 ml of Kenalog 40 mixed with 1 ml of 1% Lidocaine and 2 ml of normal saline were then instilled. The procedure was well-tolerated, and the patient was discharged thirty minutes following the injection in good condition. IMPRESSION: Technically successful initial epidural injection on the right at C7-T1. Electronically Signed   By: Nelson Chimes M.D.   On: 10/26/2020 10:58    I, Lynne Leader, personally (independently) visualized and performed the interpretation of the MRI images attached in this note.     Assessment and Plan: 55 y.o. male with cervical radiculopathy and significant spinal stenosis C-spine.  Patient had moderate benefit first epidural steroid injection.  He is out of crisis but not dramatically better  after first epidural steroid injection.  Certainly I think that a repeat epidural steroid injection in a few weeks is probably going to be reasonable if he does not continue to improve only 2 days after his first epidural steroid injection on the seventh.  He will let me know in a few weeks how he is feeling and I will be happy to order another epidural steroid injection if needed.  However based on the appearance of his cervical spine MRI I think is likely that he may benefit from surgery or at the very least a surgical consultation.  Plan to refer to neurosurgery for surgical consultation and second opinion.  Recheck with me as needed.  Of note patient has a scuba diving trip in 1 week.  He is not doing any high-impact sports.  I believe it safe for him to scuba dive.   PDMP not reviewed this encounter. Orders Placed This Encounter  Procedures   Ambulatory referral to Neurosurgery    Referral Priority:   Routine    Referral Type:   Surgical    Referral Reason:   Specialty Services Required    Requested Specialty:   Neurosurgery    Number of Visits Requested:   1   No orders of the defined types were placed in this encounter.    Discussed warning signs or symptoms. Please see discharge instructions.  Patient expresses understanding.   The above documentation has been reviewed and is accurate and complete Lynne Leader, M.D.  Total encounter time 30 minutes including face-to-face time with the patient and, reviewing past medical record, and charting on the date of service.    Reviewed MRI and discuss treatment plan and options

## 2020-10-28 ENCOUNTER — Encounter: Payer: Self-pay | Admitting: Family Medicine

## 2020-10-28 ENCOUNTER — Ambulatory Visit: Payer: BC Managed Care – PPO | Admitting: Family Medicine

## 2020-10-28 ENCOUNTER — Other Ambulatory Visit: Payer: Self-pay

## 2020-10-28 VITALS — BP 130/72 | HR 60 | Ht 73.0 in | Wt 284.6 lb

## 2020-10-28 DIAGNOSIS — M5412 Radiculopathy, cervical region: Secondary | ICD-10-CM | POA: Diagnosis not present

## 2020-10-28 DIAGNOSIS — M4802 Spinal stenosis, cervical region: Secondary | ICD-10-CM

## 2020-10-28 NOTE — Patient Instructions (Signed)
Thank you for coming in today.    

## 2020-11-09 ENCOUNTER — Other Ambulatory Visit: Payer: Self-pay

## 2020-11-09 ENCOUNTER — Encounter: Payer: BC Managed Care – PPO | Admitting: Registered Nurse

## 2020-11-22 ENCOUNTER — Other Ambulatory Visit: Payer: Self-pay | Admitting: Family

## 2020-11-23 DIAGNOSIS — M4722 Other spondylosis with radiculopathy, cervical region: Secondary | ICD-10-CM | POA: Diagnosis not present

## 2020-11-24 ENCOUNTER — Other Ambulatory Visit: Payer: Self-pay

## 2020-11-24 ENCOUNTER — Ambulatory Visit: Payer: BC Managed Care – PPO | Admitting: Family Medicine

## 2020-11-25 ENCOUNTER — Telehealth: Payer: Self-pay

## 2020-11-25 NOTE — Telephone Encounter (Signed)
..  Medication Refills  Last OV:  Camilla:  [facility]  Let patient know to contact pharmacy at the end of the day to make sure medication is ready.   Please notify patient to allow 48-72 hours to process.  Encourage patient to contact the pharmacy for refills or they can request refills through [place] out below:   Last refill:  QTY:  Refill Date:    Other Comments:  Was a patient of Wadie Lessen - has an appointment to see Dr.Saguier on 01/26/2021   Okay for refill?  Please advise.

## 2020-11-26 ENCOUNTER — Other Ambulatory Visit: Payer: Self-pay

## 2020-11-26 MED ORDER — DAPAGLIFLOZIN PROPANEDIOL 5 MG PO TABS: ORAL_TABLET | ORAL | 0 refills | Status: DC

## 2020-11-26 NOTE — Telephone Encounter (Signed)
Sent to [employer] for approval.

## 2020-11-26 NOTE — Telephone Encounter (Signed)
Other Comments:  Was a patient of Wadie Lessen - has an appointment to see Dr.Saguier on 01/26/2021. Patient was last seen 03/03/20 by Elyn Aquas.

## 2020-12-16 ENCOUNTER — Other Ambulatory Visit: Payer: Self-pay | Admitting: Family Medicine

## 2020-12-16 DIAGNOSIS — E118 Type 2 diabetes mellitus with unspecified complications: Secondary | ICD-10-CM

## 2020-12-17 ENCOUNTER — Other Ambulatory Visit: Payer: Self-pay | Admitting: Family Medicine

## 2020-12-30 ENCOUNTER — Other Ambulatory Visit: Payer: Self-pay

## 2021-01-26 ENCOUNTER — Ambulatory Visit: Payer: BC Managed Care – PPO | Admitting: Medical

## 2021-02-14 ENCOUNTER — Other Ambulatory Visit: Payer: Self-pay

## 2021-02-15 ENCOUNTER — Ambulatory Visit: Payer: BC Managed Care – PPO | Admitting: Medical

## 2021-02-15 VITALS — BP 132/78 | HR 83 | Temp 98.1°F | Resp 18 | Ht 73.0 in | Wt 280.3 lb

## 2021-02-15 DIAGNOSIS — M1A9XX0 Chronic gout, unspecified, without tophus (tophi): Secondary | ICD-10-CM | POA: Diagnosis not present

## 2021-02-15 DIAGNOSIS — E118 Type 2 diabetes mellitus with unspecified complications: Secondary | ICD-10-CM | POA: Diagnosis not present

## 2021-02-15 DIAGNOSIS — I10 Essential (primary) hypertension: Secondary | ICD-10-CM | POA: Diagnosis not present

## 2021-02-15 DIAGNOSIS — F17200 Nicotine dependence, unspecified, uncomplicated: Secondary | ICD-10-CM

## 2021-02-15 DIAGNOSIS — Z113 Encounter for screening for infections with a predominantly sexual mode of transmission: Secondary | ICD-10-CM | POA: Diagnosis not present

## 2021-02-15 DIAGNOSIS — E782 Mixed hyperlipidemia: Secondary | ICD-10-CM | POA: Diagnosis not present

## 2021-02-15 DIAGNOSIS — Z0000 Encounter for general adult medical examination without abnormal findings: Secondary | ICD-10-CM | POA: Diagnosis not present

## 2021-02-15 DIAGNOSIS — Z125 Encounter for screening for malignant neoplasm of prostate: Secondary | ICD-10-CM

## 2021-02-15 DIAGNOSIS — Z1283 Encounter for screening for malignant neoplasm of skin: Secondary | ICD-10-CM

## 2021-02-15 DIAGNOSIS — Z1211 Encounter for screening for malignant neoplasm of colon: Secondary | ICD-10-CM

## 2021-02-15 LAB — CBC WITH DIFFERENTIAL/PLATELET
Basophils Absolute: 0.1 10*3/uL (ref 0.0–0.1)
Basophils Relative: 1.2 % (ref 0.0–3.0)
Eosinophils Absolute: 0.3 10*3/uL (ref 0.0–0.7)
Eosinophils Relative: 3.4 % (ref 0.0–5.0)
HCT: 46.6 % (ref 39.0–52.0)
Hemoglobin: 15.9 g/dL (ref 13.0–17.0)
Lymphocytes Relative: 18 % (ref 12.0–46.0)
Lymphs Abs: 1.4 10*3/uL (ref 0.7–4.0)
MCHC: 34.1 g/dL (ref 30.0–36.0)
MCV: 95 fl (ref 78.0–100.0)
Monocytes Absolute: 0.6 10*3/uL (ref 0.1–1.0)
Monocytes Relative: 7.4 % (ref 3.0–12.0)
Neutro Abs: 5.5 10*3/uL (ref 1.4–7.7)
Neutrophils Relative %: 70 % (ref 43.0–77.0)
Platelets: 110 10*3/uL — ABNORMAL LOW (ref 150.0–400.0)
RBC: 4.9 Mil/uL (ref 4.22–5.81)
RDW: 14.1 % (ref 11.5–15.5)
WBC: 7.8 10*3/uL (ref 4.0–10.5)

## 2021-02-15 LAB — COMPREHENSIVE METABOLIC PANEL
ALT: 47 U/L (ref 0–53)
AST: 43 U/L — ABNORMAL HIGH (ref 0–37)
Albumin: 4.4 g/dL (ref 3.5–5.2)
Alkaline Phosphatase: 90 U/L (ref 39–117)
BUN: 25 mg/dL — ABNORMAL HIGH (ref 6–23)
CO2: 23 mEq/L (ref 19–32)
Calcium: 9.6 mg/dL (ref 8.4–10.5)
Chloride: 102 mEq/L (ref 96–112)
Creatinine, Ser: 1.09 mg/dL (ref 0.40–1.50)
GFR: 75.46 mL/min (ref >60.00)
Glucose, Bld: 158 mg/dL — ABNORMAL HIGH (ref 70–99)
Potassium: 3.8 mEq/L (ref 3.5–5.1)
Sodium: 137 mEq/L (ref 135–145)
Total Bilirubin: 0.9 mg/dL (ref 0.2–1.2)
Total Protein: 7.5 g/dL (ref 6.0–8.3)

## 2021-02-15 LAB — HEMOGLOBIN A1C: Hgb A1c MFr Bld: 8.8 % — ABNORMAL HIGH (ref 4.6–6.5)

## 2021-02-15 LAB — LIPID PANEL
Cholesterol: 234 mg/dL — ABNORMAL HIGH (ref 0–200)
HDL: 33 mg/dL — ABNORMAL LOW (ref >39.00)
Total CHOL/HDL Ratio: 7
Triglycerides: 561 mg/dL — ABNORMAL HIGH (ref 0.0–149.0)

## 2021-02-15 LAB — URIC ACID: Uric Acid, Serum: 9 mg/dL — ABNORMAL HIGH (ref 4.0–7.8)

## 2021-02-15 LAB — PSA: PSA: 0.14 ng/mL (ref 0.10–4.00)

## 2021-02-15 LAB — LDL CHOLESTEROL, DIRECT: Direct LDL: 133 mg/dL

## 2021-02-15 MED ORDER — DAPAGLIFLOZIN PROPANEDIOL 10 MG PO TABS: 10.0000 mg | ORAL_TABLET | Freq: Every day | ORAL | 3 refills | Status: DC

## 2021-02-15 MED ORDER — ALLOPURINOL 100 MG PO TABS: 100.0000 mg | ORAL_TABLET | Freq: Two times a day (BID) | ORAL | 1 refills | Status: DC

## 2021-02-15 MED ORDER — ATORVASTATIN CALCIUM 10 MG PO TABS: 10.0000 mg | ORAL_TABLET | Freq: Every day | ORAL | 3 refills | Status: DC

## 2021-02-15 NOTE — Patient Instructions (Addendum)
For you wellness exam today I have ordered cbc, cmp,  lipid panel and hiv.  Flu vaccine and covid vaccine declined. If you want shingrix advise contact insurance to see if covered and notify us if you want to get.   Refer to GI MD. Screening colonoscopy.  Recommend exercise and healthy diet.  We will let you know lab results as they come in.  Follow up date appointment will be determined after lab review.     Htn. Bp well controlled.  Diabtes will follow a1c. Refilled farxiga at 10 mg since recent very high sugars.  For hyperlipidemia. No statin use presently. Formerly on and pcp took him off?   Hx of gout. On allopurinol. Will get uric acid today.  Smoker. Advised to stop. Could offer med wellbutrin in future if you desire assistance.  Screening psa.  Refer to derm for screening skin cancer.  Preventive Care 34-56 Years Old, Male Preventive care refers to lifestyle choices and visits with your health care provider that can promote health and wellness. This includes: A yearly physical exam. This is also called an annual wellness visit. Regular dental and eye exams. Immunizations. Screening for certain conditions. Healthy lifestyle choices, such as: Eating a healthy diet. Getting regular exercise. Not using drugs or products that contain nicotine and tobacco. Limiting alcohol use. What can I expect for my preventive care visit? Physical exam Your health care provider will check your: Height and weight. These may be used to calculate your BMI (body mass index). BMI is a measurement that tells if you are at a healthy weight. Heart rate and blood pressure. Body temperature. Skin for abnormal spots. Counseling Your health care provider may ask you questions about your: Past medical problems. Family's medical history. Alcohol, tobacco, and drug use. Emotional well-being. Home life and relationship well-being. Sexual activity. Diet, exercise, and sleep habits. Work and  work Statistician. Access to firearms. What immunizations do I need? Vaccines are usually given at various ages, according to a schedule. Your health care provider will recommend vaccines for you based on your age, medical history, and lifestyle or other factors, such as travel or where you work. What tests do I need? Blood tests Lipid and cholesterol levels. These may be checked every 5 years, or more often if you are over 49 years old. Hepatitis C test. Hepatitis B test. Screening Lung cancer screening. You may have this screening every year starting at age 43 if you have a 30-pack-year history of smoking and currently smoke or have quit within the past 15 years. Prostate cancer screening. Recommendations will vary depending on your family history and other risks. Genital exam to check for testicular cancer or hernias. Colorectal cancer screening. All adults should have this screening starting at age 53 and continuing until age 5. Your health care provider may recommend screening at age 68 if you are at increased risk. You will have tests every 1-10 years, depending on your results and the type of screening test. Diabetes screening. This is done by checking your blood sugar (glucose) after you have not eaten for a while (fasting). You may have this done every 1-3 years. STD (sexually transmitted disease) testing, if you are at risk. Follow these instructions at home: Eating and drinking  Eat a diet that includes fresh fruits and vegetables, whole grains, lean protein, and low-fat dairy products. Take vitamin and mineral supplements as recommended by your health care provider. Do not drink alcohol if your health care provider tells you  not to drink. If you drink alcohol: Limit how much you have to 0-2 drinks a day. Be aware of how much alcohol is in your drink. In the [place] one drink equals one 12 oz bottle of beer (355 mL), one 5 oz glass of wine (148 mL), or one 1 oz glass of hard  liquor (44 mL). Lifestyle Take daily care of your teeth and gums. Brush your teeth every morning and night with fluoride toothpaste. Floss one time each day. Stay active. Exercise for at least 30 minutes 5 or more days each week. Do not use any products that contain nicotine or tobacco, such as cigarettes, e-cigarettes, and chewing tobacco. If you need help quitting, ask your health care provider. Do not use drugs. If you are sexually active, practice safe sex. Use a condom or other form of protection to prevent STIs (sexually transmitted infections). If told by your health care provider, take low-dose aspirin daily starting at age 48. Find healthy ways to cope with stress, such as: Meditation, yoga, or listening to music. Journaling. Talking to a trusted person. Spending time with friends and family. Safety Always wear your seat belt while driving or riding in a vehicle. Do not drive: If you have been drinking alcohol. Do not ride with someone who has been drinking. When you are tired or distracted. While texting. Wear a helmet and other protective equipment during sports activities. If you have firearms in your house, make sure you follow all gun safety procedures. What's next? Go to your health care provider once a year for an annual wellness visit. Ask your health care provider how often you should have your eyes and teeth checked. Stay up to date on all vaccines. This information is not intended to replace advice given to you by your health care provider. Make sure you discuss any questions you have with your health care provider. Document Revised: 07/16/2020 Document Reviewed: 05/02/2018 Elsevier Patient Education  2022 [employer].

## 2021-02-15 NOTE — Progress Notes (Signed)
Subjective:    Patient ID: Louis Davis, male    DOB: 08/04/1965, 55 y.o.   MRN: [id number]  HPI  Pt in for first time with me.   Pt works Chief Financial Officer for [employer]. Pt does not exercise much. Poor diet up until last week when improved. Some alcohol use but not recently. If drinks 2-3 drinks a week. Smokes 3/4pack a day. Overall smoker for 30 years.  On review no recent wellness exam. Since new pt decided to go ahead and do wellness evaluate health maintenance.  Pt has not had colonoscopy. He states would do in January and February.  Pt has hx of htn, diabetes, gout and neuropathy(associated with spinal stenosis along with degenerative changes cervical spine).    Pt bp well controlled. Pt on benicar/hctz. 40-25 1 tab daily.  Pt is diabetic. On week  ago he got very high readings near 500. He started to eat a lot less/better. Stopped carbs and sugar came down to 300. Pt is on metformin xr 2 tab po q day. Pt has been out of [facility] for 2 months.  Last a1c was 8.4.   Hx of gout. He is on allopurinol.   Pt also mentions had stress test in 2018. Unremarkable results per Dr. Geraldo Pitter.   Pt has been able to quite smoking for 13 years years ago after his mom had stroke but then he restarted. Back then he stopped cold [place]. He did not use medication.      Review of Systems  Constitutional:  Negative for chills, fatigue and fever.  HENT:  Negative for dental problem.   Respiratory:  Negative for cough, chest tightness, shortness of breath and wheezing.   Cardiovascular:  Negative for chest pain and palpitations.  Gastrointestinal:  Negative for abdominal pain, anal bleeding, constipation, nausea and vomiting.  Genitourinary:  Negative for dysuria and frequency.  Musculoskeletal:  Negative for back pain, myalgias and neck pain.  Skin:  Negative for rash.  Neurological:  Negative for tremors, seizures, facial asymmetry, weakness and light-headedness.  Hematological:  Negative  for adenopathy. Does not bruise/bleed easily.  Psychiatric/Behavioral:  Negative for behavioral problems, confusion, dysphoric mood and self-injury.    Past Medical History:  Diagnosis Date   Chicken pox    Gout    HTN (hypertension)    Hx of diabetes mellitus    Resolved   Skin lesion    Benign     Social History   Socioeconomic History   Marital status: Divorced    Spouse name: Not on file   Number of children: Not on file   Years of education: Not on file   Highest education level: Not on file  Occupational History   Not on file  Tobacco Use   Smoking status: Former    Types: Cigarettes    Quit date: 05/14/2014    Years since quitting: 6.7   Smokeless tobacco: Never  Vaping Use   Vaping Use: Never used  Substance and Sexual Activity   Alcohol use: Yes    Alcohol/week: 7.0 standard drinks    Types: 7 Standard drinks or equivalent per week    Comment: occasional   Drug use: No   Sexual activity: Yes  Other Topics Concern   Not on file  Social History Narrative   Not on file   Social Determinants of Health   Financial Resource Strain: Not on file  Food Insecurity: Not on file  Transportation Needs: Not on file  Physical  Activity: Not on file  Stress: Not on file  Social Connections: Not on file  Intimate Partner Violence: Not on file    Past Surgical History:  Procedure Laterality Date   ARTERY EXPLORATION     Right Forearm   TONSILLECTOMY AND ADENOIDECTOMY  1973    Family History  Problem Relation Age of Onset   Heart disease Father 64       Deceased   Heart attack Father    Hypertension Father    Diabetes Father    Arthritis-Osteo Mother        Living   Colon cancer Mother    Stroke Mother    Hypertension Mother    Breast cancer Mother    Asthma Mother    Heart disease Paternal Grandfather    Heart attack Paternal Grandfather    Heart attack Maternal Grandmother    Healthy Sister    Healthy Brother    Asthma Son        x2   Asthma  Maternal Grandfather     Allergies  Allergen Reactions   Penicillins Anaphylaxis    Current Outpatient Medications on File Prior to Visit  Medication Sig Dispense Refill   allopurinol (ZYLOPRIM) 100 MG tablet Take 1 tablet (100 mg total) by mouth daily. 90 tablet 1   aspirin 81 MG tablet Take 81 mg by mouth daily.     dapagliflozin propanediol (FARXIGA) 5 MG TABS tablet TAKE 1 TABLET BY MOUTH EVERY DAY BEFORE BREAKFAST 30 tablet 0   gabapentin (NEURONTIN) 300 MG capsule Take 1-2 capsules (300-600 mg total) by mouth 3 (three) times daily as needed. 90 capsule 3   metFORMIN (GLUCOPHAGE-XR) 500 MG 24 hr tablet TAKE 2 TABLETS BY MOUTH TWICE A DAY 360 tablet 0   olmesartan-hydrochlorothiazide (BENICAR HCT) 40-25 MG tablet TAKE 1 TABLET BY MOUTH EVERY DAY 90 tablet 0   No current facility-administered medications on file prior to visit.    BP 132/78   Pulse 83   Temp 98.1 F (36.7 C)   Resp 18   Ht 6\' 1"  (1.854 m)   Wt 280 lb 4.8 oz (127.1 kg)   SpO2 98%   BMI 36.98 kg/m        Objective:   Physical Exam  General Mental Status- Alert. General Appearance- Not in acute distress.   Skin Various sized moles with varying shades of color. Light brown to few on darker side.   Neck Carotid Arteries- Normal color. Moisture- Normal Moisture. No carotid bruits. No JVD.  Chest and Lung Exam Auscultation: Breath Sounds:-Normal.  Cardiovascular Auscultation:Rythm- Regular. Murmurs & Other Heart Sounds:Auscultation of the heart reveals- No Murmurs.  Abdomen Inspection:-Inspeection Normal. Palpation/Percussion:Note:No mass. Palpation and Percussion of the abdomen reveal- Non Tender, Non Distended + BS, no rebound or guarding.   Neurologic Cranial Nerve exam:- CN III-XII intact(No nystagmus), symmetric smile. Strength:- 5/5 equal and symmetric strength both upper and lower extremities.       Assessment & Plan:  For you wellness exam today I have ordered cbc, cmp,  lipid  panel and hiv.  Flu vaccine and covid vaccine declined. If you want shingrix advise contact insurance to see if covered and notify us if you want to get.   Refer to GI MD. Screening colonoscopy.  Recommend exercise and healthy diet.  We will let you know lab results as they come in.  Follow up date appointment will be determined after lab review.     Htn. Bp well controlled.  Diabtes will follow a1c. Refilled farxiga at 10 mg since recent very high sugars.  For hyperlipidemia. No statin use presently. Formerly on and pcp took him off?   Hx of gout. On allopurinol. Will get uric acid today.  Smoker. Advised to stop. Could offer med wellbutrin in future if you desire assistance.  Screening psa.  Refer to derm for screening skin cancer.  Mackie Pai, PA-C    Also [phone] charge as address all chronic problems in addition to wellness exam today.

## 2021-02-15 NOTE — Addendum Note (Signed)
Addended by: Anabel Halon on: 02/15/2021 08:09 PM   Modules accepted: Orders

## 2021-02-16 LAB — HIV ANTIBODY (ROUTINE TESTING W REFLEX): HIV 1&2 Ab, 4th Generation: NONREACTIVE

## 2021-03-16 ENCOUNTER — Other Ambulatory Visit: Payer: Self-pay | Admitting: Family Medicine

## 2021-03-16 DIAGNOSIS — E118 Type 2 diabetes mellitus with unspecified complications: Secondary | ICD-10-CM

## 2021-03-18 ENCOUNTER — Other Ambulatory Visit: Payer: Self-pay | Admitting: Family Medicine

## 2021-03-18 DIAGNOSIS — E118 Type 2 diabetes mellitus with unspecified complications: Secondary | ICD-10-CM

## 2021-03-21 ENCOUNTER — Other Ambulatory Visit: Payer: Self-pay | Admitting: Family Medicine

## 2021-03-21 ENCOUNTER — Encounter: Payer: Self-pay | Admitting: Medical

## 2021-03-21 DIAGNOSIS — E118 Type 2 diabetes mellitus with unspecified complications: Secondary | ICD-10-CM

## 2021-03-21 MED ORDER — METFORMIN HCL ER 500 MG PO TB24: 1000.0000 mg | ORAL_TABLET | Freq: Two times a day (BID) | ORAL | 0 refills | Status: DC

## 2021-03-21 MED ORDER — OLMESARTAN MEDOXOMIL-HCTZ 40-25 MG PO TABS: 1.0000 | ORAL_TABLET | Freq: Every day | ORAL | 0 refills | Status: DC

## 2021-03-21 MED ORDER — DAPAGLIFLOZIN PROPANEDIOL 10 MG PO TABS: 10.0000 mg | ORAL_TABLET | Freq: Every day | ORAL | 3 refills | Status: AC

## 2021-03-28 ENCOUNTER — Other Ambulatory Visit: Payer: Self-pay | Admitting: Medical

## 2021-03-28 DIAGNOSIS — E118 Type 2 diabetes mellitus with unspecified complications: Secondary | ICD-10-CM

## 2021-03-28 MED ORDER — METFORMIN HCL ER 500 MG PO TB24: 1000.0000 mg | ORAL_TABLET | Freq: Two times a day (BID) | ORAL | 0 refills | Status: DC

## 2021-05-05 ENCOUNTER — Other Ambulatory Visit: Payer: Self-pay | Admitting: Medical

## 2021-06-18 ENCOUNTER — Other Ambulatory Visit: Payer: Self-pay | Admitting: Medical

## 2021-08-24 ENCOUNTER — Other Ambulatory Visit: Payer: Self-pay | Admitting: Medical

## 2021-08-24 DIAGNOSIS — M1A9XX0 Chronic gout, unspecified, without tophus (tophi): Secondary | ICD-10-CM

## 2021-09-08 ENCOUNTER — Other Ambulatory Visit: Payer: Self-pay | Admitting: Medical

## 2021-09-08 DIAGNOSIS — E118 Type 2 diabetes mellitus with unspecified complications: Secondary | ICD-10-CM

## 2021-09-16 ENCOUNTER — Other Ambulatory Visit: Payer: Self-pay | Admitting: Medical

## 2022-02-26 ENCOUNTER — Other Ambulatory Visit: Payer: Self-pay | Admitting: Medical

## 2022-02-26 DIAGNOSIS — E118 Type 2 diabetes mellitus with unspecified complications: Secondary | ICD-10-CM

## 2022-02-27 ENCOUNTER — Other Ambulatory Visit: Payer: Self-pay | Admitting: Medical

## 2022-02-27 MED ORDER — METFORMIN HCL ER 500 MG PO TB24: 1000.0000 mg | ORAL_TABLET | Freq: Two times a day (BID) | ORAL | 0 refills | Status: DC

## 2022-02-27 MED ORDER — OLMESARTAN MEDOXOMIL-HCTZ 40-25 MG PO TABS: 1.0000 | ORAL_TABLET | Freq: Every day | ORAL | 0 refills | Status: AC

## 2022-08-04 ENCOUNTER — Other Ambulatory Visit: Payer: Self-pay | Admitting: Medical

## 2022-08-04 DIAGNOSIS — E118 Type 2 diabetes mellitus with unspecified complications: Secondary | ICD-10-CM

## 2022-09-06 ENCOUNTER — Telehealth: Payer: Self-pay | Admitting: Medical

## 2022-09-06 NOTE — Telephone Encounter (Signed)
About 1 and half year since I saw pt. Pt has not followed up. Can you get him scheduled for follow up. Let me know I got messages from insurance company asking about meds they think he should be on. Having not seen him in so long can't manage meds. If he is not going to schedule let me know.

## 2022-09-08 NOTE — Telephone Encounter (Signed)
Letter sent.

## 2022-09-24 ENCOUNTER — Other Ambulatory Visit: Payer: Self-pay | Admitting: Medical

## 2022-09-24 DIAGNOSIS — M1A9XX0 Chronic gout, unspecified, without tophus (tophi): Secondary | ICD-10-CM

## 2022-11-06 ENCOUNTER — Other Ambulatory Visit: Payer: Self-pay | Admitting: Medical

## 2022-11-06 DIAGNOSIS — E118 Type 2 diabetes mellitus with unspecified complications: Secondary | ICD-10-CM

## 2022-12-29 ENCOUNTER — Other Ambulatory Visit: Payer: Self-pay | Admitting: Medical

## 2022-12-29 DIAGNOSIS — M1A9XX0 Chronic gout, unspecified, without tophus (tophi): Secondary | ICD-10-CM

## 2023-02-06 ENCOUNTER — Other Ambulatory Visit: Payer: Self-pay | Admitting: Medical

## 2023-02-06 DIAGNOSIS — E118 Type 2 diabetes mellitus with unspecified complications: Secondary | ICD-10-CM

## 2023-05-07 ENCOUNTER — Other Ambulatory Visit: Payer: Self-pay | Admitting: Medical

## 2023-05-07 DIAGNOSIS — E118 Type 2 diabetes mellitus with unspecified complications: Secondary | ICD-10-CM

## 2023-06-26 ENCOUNTER — Other Ambulatory Visit: Payer: Self-pay | Admitting: Medical

## 2023-06-26 DIAGNOSIS — M1A9XX0 Chronic gout, unspecified, without tophus (tophi): Secondary | ICD-10-CM
